# Patient Record
Sex: Male | Born: 1955 | Race: White | Hispanic: No | Marital: Married | Smoking: Former smoker
Health system: Southern US, Community
[De-identification: ages and names within clinical notes are randomized; demographics above are authoritative.]

## PROBLEM LIST (undated history)

## (undated) DIAGNOSIS — I2510 Atherosclerotic heart disease of native coronary artery without angina pectoris: Secondary | ICD-10-CM

## (undated) DIAGNOSIS — J449 Chronic obstructive pulmonary disease, unspecified: Secondary | ICD-10-CM

## (undated) DIAGNOSIS — F1011 Alcohol abuse, in remission: Secondary | ICD-10-CM

## (undated) DIAGNOSIS — R0681 Apnea, not elsewhere classified: Secondary | ICD-10-CM

## (undated) DIAGNOSIS — N281 Cyst of kidney, acquired: Secondary | ICD-10-CM

## (undated) DIAGNOSIS — K439 Ventral hernia without obstruction or gangrene: Secondary | ICD-10-CM

## (undated) DIAGNOSIS — I10 Essential (primary) hypertension: Secondary | ICD-10-CM

## (undated) DIAGNOSIS — K219 Gastro-esophageal reflux disease without esophagitis: Secondary | ICD-10-CM

## (undated) HISTORY — DX: Apnea, not elsewhere classified: R06.81

## (undated) HISTORY — DX: Ventral hernia without obstruction or gangrene: K43.9

## (undated) HISTORY — DX: Atherosclerotic heart disease of native coronary artery without angina pectoris: I25.10

## (undated) HISTORY — DX: Alcohol abuse, in remission: F10.11

## (undated) HISTORY — DX: Essential (primary) hypertension: I10

## (undated) HISTORY — PX: CARDIAC CATHETERIZATION: SHX172

## (undated) HISTORY — PX: CHOLECYSTECTOMY: SHX55

## (undated) HISTORY — PX: HERNIA REPAIR: SHX51

## (undated) HISTORY — DX: Gastro-esophageal reflux disease without esophagitis: K21.9

## (undated) HISTORY — PX: OTHER SURGICAL HISTORY: SHX169

## (undated) HISTORY — DX: Chronic obstructive pulmonary disease, unspecified: J44.9

## (undated) HISTORY — DX: Cyst of kidney, acquired: N28.1

---

## 2005-12-14 ENCOUNTER — Ambulatory Visit: Payer: Self-pay | Admitting: Family Medicine

## 2006-11-16 ENCOUNTER — Ambulatory Visit: Payer: Self-pay | Admitting: Gastroenterology

## 2008-06-12 ENCOUNTER — Inpatient Hospital Stay: Payer: Self-pay | Admitting: Internal Medicine

## 2008-08-21 ENCOUNTER — Ambulatory Visit: Payer: Self-pay | Admitting: Internal Medicine

## 2008-10-01 ENCOUNTER — Ambulatory Visit: Payer: Self-pay | Admitting: Family Medicine

## 2011-10-03 ENCOUNTER — Ambulatory Visit: Payer: Self-pay | Admitting: Gastroenterology

## 2011-10-03 HISTORY — PX: ESOPHAGOGASTRODUODENOSCOPY: SHX1529

## 2011-10-05 LAB — PATHOLOGY REPORT

## 2011-12-29 DIAGNOSIS — Z0189 Encounter for other specified special examinations: Secondary | ICD-10-CM | POA: Insufficient documentation

## 2011-12-29 DIAGNOSIS — F1011 Alcohol abuse, in remission: Secondary | ICD-10-CM

## 2011-12-29 DIAGNOSIS — Z7189 Other specified counseling: Secondary | ICD-10-CM | POA: Insufficient documentation

## 2011-12-29 DIAGNOSIS — N281 Cyst of kidney, acquired: Secondary | ICD-10-CM

## 2011-12-29 DIAGNOSIS — K219 Gastro-esophageal reflux disease without esophagitis: Secondary | ICD-10-CM

## 2011-12-29 DIAGNOSIS — I2510 Atherosclerotic heart disease of native coronary artery without angina pectoris: Secondary | ICD-10-CM | POA: Insufficient documentation

## 2011-12-29 DIAGNOSIS — K439 Ventral hernia without obstruction or gangrene: Secondary | ICD-10-CM

## 2011-12-29 HISTORY — DX: Gastro-esophageal reflux disease without esophagitis: K21.9

## 2011-12-29 HISTORY — DX: Atherosclerotic heart disease of native coronary artery without angina pectoris: I25.10

## 2011-12-29 HISTORY — DX: Cyst of kidney, acquired: N28.1

## 2011-12-29 HISTORY — DX: Ventral hernia without obstruction or gangrene: K43.9

## 2011-12-29 HISTORY — DX: Alcohol abuse, in remission: F10.11

## 2012-12-28 ENCOUNTER — Ambulatory Visit: Payer: Self-pay | Admitting: Family Medicine

## 2013-02-08 HISTORY — PX: COLONOSCOPY: SHX174

## 2013-04-02 DIAGNOSIS — J9801 Acute bronchospasm: Secondary | ICD-10-CM | POA: Insufficient documentation

## 2013-04-16 ENCOUNTER — Ambulatory Visit: Payer: Self-pay | Admitting: Family Medicine

## 2013-04-30 DIAGNOSIS — J449 Chronic obstructive pulmonary disease, unspecified: Secondary | ICD-10-CM | POA: Insufficient documentation

## 2013-04-30 HISTORY — DX: Chronic obstructive pulmonary disease, unspecified: J44.9

## 2014-01-09 DIAGNOSIS — M25473 Effusion, unspecified ankle: Secondary | ICD-10-CM | POA: Insufficient documentation

## 2014-02-12 DIAGNOSIS — I10 Essential (primary) hypertension: Secondary | ICD-10-CM | POA: Insufficient documentation

## 2014-02-12 HISTORY — DX: Essential (primary) hypertension: I10

## 2014-02-13 DIAGNOSIS — E782 Mixed hyperlipidemia: Secondary | ICD-10-CM | POA: Insufficient documentation

## 2014-02-20 ENCOUNTER — Emergency Department: Payer: Self-pay | Admitting: Emergency Medicine

## 2014-02-20 LAB — CBC WITH DIFFERENTIAL/PLATELET
BASOS PCT: 1 %
Basophil #: 0.1 10*3/uL (ref 0.0–0.1)
Eosinophil #: 0.4 10*3/uL (ref 0.0–0.7)
Eosinophil %: 3.6 %
HCT: 41.8 % (ref 40.0–52.0)
HGB: 14.1 g/dL (ref 13.0–18.0)
LYMPHS ABS: 2.8 10*3/uL (ref 1.0–3.6)
Lymphocyte %: 28.2 %
MCH: 31.8 pg (ref 26.0–34.0)
MCHC: 33.7 g/dL (ref 32.0–36.0)
MCV: 94 fL (ref 80–100)
Monocyte #: 0.5 x10 3/mm (ref 0.2–1.0)
Monocyte %: 5.5 %
NEUTROS PCT: 61.7 %
Neutrophil #: 6.2 10*3/uL (ref 1.4–6.5)
PLATELETS: 211 10*3/uL (ref 150–440)
RBC: 4.43 10*6/uL (ref 4.40–5.90)
RDW: 14 % (ref 11.5–14.5)
WBC: 10 10*3/uL (ref 3.8–10.6)

## 2014-02-20 LAB — COMPREHENSIVE METABOLIC PANEL
Albumin: 3.8 g/dL (ref 3.4–5.0)
Alkaline Phosphatase: 85 U/L
Anion Gap: 11 (ref 7–16)
BILIRUBIN TOTAL: 0.6 mg/dL (ref 0.2–1.0)
BUN: 19 mg/dL — ABNORMAL HIGH (ref 7–18)
Calcium, Total: 8.6 mg/dL (ref 8.5–10.1)
Chloride: 105 mmol/L (ref 98–107)
Co2: 27 mmol/L (ref 21–32)
Creatinine: 0.92 mg/dL (ref 0.60–1.30)
EGFR (African American): >60
EGFR (Non-African Amer.): >60
Glucose: 100 mg/dL — ABNORMAL HIGH (ref 65–99)
Osmolality: 287 (ref 275–301)
Potassium: 3.5 mmol/L (ref 3.5–5.1)
SGOT(AST): 26 U/L (ref 15–37)
SGPT (ALT): 41 U/L
Sodium: 143 mmol/L (ref 136–145)
Total Protein: 7.1 g/dL (ref 6.4–8.2)

## 2014-02-20 LAB — MAGNESIUM: Magnesium: 0.7 mg/dL — ABNORMAL LOW

## 2014-06-09 DIAGNOSIS — R0681 Apnea, not elsewhere classified: Secondary | ICD-10-CM

## 2014-06-09 HISTORY — DX: Apnea, not elsewhere classified: R06.81

## 2014-07-22 DIAGNOSIS — R002 Palpitations: Secondary | ICD-10-CM | POA: Insufficient documentation

## 2014-11-25 ENCOUNTER — Emergency Department
Admission: EM | Admit: 2014-11-25 | Discharge: 2014-11-25 | Disposition: A | Discharge status: Home / Self Care | Payer: Managed Care, Other (non HMO) | Attending: Emergency Medicine | Admitting: Emergency Medicine

## 2014-11-25 ENCOUNTER — Emergency Department: Payer: Managed Care, Other (non HMO)

## 2014-11-25 ENCOUNTER — Encounter: Payer: Self-pay | Admitting: Emergency Medicine

## 2014-11-25 DIAGNOSIS — R1013 Epigastric pain: Secondary | ICD-10-CM | POA: Diagnosis present

## 2014-11-25 DIAGNOSIS — K2980 Duodenitis without bleeding: Secondary | ICD-10-CM | POA: Diagnosis not present

## 2014-11-25 DIAGNOSIS — Z7951 Long term (current) use of inhaled steroids: Secondary | ICD-10-CM | POA: Insufficient documentation

## 2014-11-25 DIAGNOSIS — Z79899 Other long term (current) drug therapy: Secondary | ICD-10-CM | POA: Insufficient documentation

## 2014-11-25 DIAGNOSIS — Z7982 Long term (current) use of aspirin: Secondary | ICD-10-CM | POA: Diagnosis not present

## 2014-11-25 DIAGNOSIS — I10 Essential (primary) hypertension: Secondary | ICD-10-CM | POA: Insufficient documentation

## 2014-11-25 DIAGNOSIS — Z87891 Personal history of nicotine dependence: Secondary | ICD-10-CM | POA: Diagnosis not present

## 2014-11-25 HISTORY — DX: Essential (primary) hypertension: I10

## 2014-11-25 HISTORY — DX: Chronic obstructive pulmonary disease, unspecified: J44.9

## 2014-11-25 LAB — URINALYSIS COMPLETE WITH MICROSCOPIC (ARMC ONLY)
Bacteria, UA: NONE SEEN
Bilirubin Urine: NEGATIVE
GLUCOSE, UA: NEGATIVE mg/dL
HGB URINE DIPSTICK: NEGATIVE
Leukocytes, UA: NEGATIVE
Nitrite: NEGATIVE
PH: 8 (ref 5.0–8.0)
Protein, ur: 30 mg/dL — AB
SQUAMOUS EPITHELIAL / LPF: NONE SEEN
Specific Gravity, Urine: 1.017 (ref 1.005–1.030)

## 2014-11-25 LAB — CBC WITH DIFFERENTIAL/PLATELET
BASOS ABS: 0.1 10*3/uL (ref 0–0.1)
Eosinophils Absolute: 0.1 10*3/uL (ref 0–0.7)
Eosinophils Relative: 1 %
HCT: 43.6 % (ref 40.0–52.0)
Hemoglobin: 14.9 g/dL (ref 13.0–18.0)
Lymphs Abs: 0.8 10*3/uL — ABNORMAL LOW (ref 1.0–3.6)
MCH: 31.8 pg (ref 26.0–34.0)
MCHC: 34.2 g/dL (ref 32.0–36.0)
MCV: 92.8 fL (ref 80.0–100.0)
Monocytes Absolute: 0.4 10*3/uL (ref 0.2–1.0)
Monocytes Relative: 3 %
NEUTROS ABS: 11.5 10*3/uL — AB (ref 1.4–6.5)
Neutrophils Relative %: 90 %
Platelets: 159 10*3/uL (ref 150–440)
RBC: 4.7 MIL/uL (ref 4.40–5.90)
RDW: 13.6 % (ref 11.5–14.5)
WBC: 12.8 10*3/uL — ABNORMAL HIGH (ref 3.8–10.6)

## 2014-11-25 LAB — LIPASE, BLOOD: LIPASE: 28 U/L (ref 22–51)

## 2014-11-25 LAB — COMPREHENSIVE METABOLIC PANEL
ALK PHOS: 76 U/L (ref 38–126)
ALT: 23 U/L (ref 17–63)
AST: 31 U/L (ref 15–41)
Albumin: 4.2 g/dL (ref 3.5–5.0)
Anion gap: 11 (ref 5–15)
BUN: 16 mg/dL (ref 6–20)
CO2: 26 mmol/L (ref 22–32)
CREATININE: 1.07 mg/dL (ref 0.61–1.24)
Calcium: 9.4 mg/dL (ref 8.9–10.3)
Chloride: 101 mmol/L (ref 101–111)
GFR calc non Af Amer: >60 mL/min (ref >60)
GLUCOSE: 158 mg/dL — AB (ref 65–99)
Potassium: 3.5 mmol/L (ref 3.5–5.1)
Sodium: 138 mmol/L (ref 135–145)
Total Bilirubin: 1.3 mg/dL — ABNORMAL HIGH (ref 0.3–1.2)
Total Protein: 7 g/dL (ref 6.5–8.1)

## 2014-11-25 LAB — TROPONIN I
Troponin I: <0.03 ng/mL (ref <0.031)
Troponin I: <0.03 ng/mL (ref <0.031)

## 2014-11-25 MED ORDER — GI COCKTAIL ~~LOC~~
30.0000 mL | Freq: Once | ORAL | Status: AC
  Administered 2014-11-25: 30 mL via ORAL

## 2014-11-25 MED ORDER — GI COCKTAIL ~~LOC~~
ORAL | Status: AC
  Administered 2014-11-25: 30 mL via ORAL
  Filled 2014-11-25: qty 30

## 2014-11-25 MED ORDER — IOHEXOL 300 MG/ML  SOLN
100.0000 mL | Freq: Once | INTRAMUSCULAR | Status: AC | PRN
  Administered 2014-11-25: 100 mL via INTRAVENOUS

## 2014-11-25 MED ORDER — ASPIRIN 81 MG PO CHEW
CHEWABLE_TABLET | ORAL | Status: AC
  Administered 2014-11-25: 243 mg via ORAL
  Filled 2014-11-25: qty 3

## 2014-11-25 MED ORDER — ONDANSETRON HCL 4 MG/2ML IJ SOLN
4.0000 mg | Freq: Once | INTRAMUSCULAR | Status: AC
  Administered 2014-11-25: 4 mg via INTRAVENOUS
  Filled 2014-11-25: qty 2

## 2014-11-25 MED ORDER — ASPIRIN 81 MG PO CHEW
243.0000 mg | CHEWABLE_TABLET | Freq: Once | ORAL | Status: AC
  Administered 2014-11-25: 243 mg via ORAL

## 2014-11-25 MED ORDER — OMEPRAZOLE 20 MG PO CPDR
20.0000 mg | DELAYED_RELEASE_CAPSULE | Freq: Two times a day (BID) | ORAL | Status: DC
Start: 2014-11-25 — End: 2015-01-01

## 2014-11-25 MED ORDER — IOHEXOL 240 MG/ML SOLN
25.0000 mL | Freq: Once | INTRAMUSCULAR | Status: AC | PRN
Start: 2014-11-25 — End: 2014-11-25
  Administered 2014-11-25: 25 mL via ORAL

## 2014-11-25 NOTE — ED Notes (Signed)
Patient ambulatory to triage with steady gait, without difficulty or distress noted; pt reports since 10pm having mid upper abd pain accomp by N/V; denies hx of same

## 2014-11-25 NOTE — ED Notes (Signed)
Finished contrast called CT. CT awaiting on labs

## 2014-11-25 NOTE — ED Notes (Signed)

## 2014-11-25 NOTE — ED Notes (Signed)
20 gauge IV removed from left antecubital, catheter removed intact, dressing applied, bleeding controlled.

## 2014-11-25 NOTE — ED Notes (Signed)
MD at bedside. 

## 2014-11-25 NOTE — ED Provider Notes (Signed)
[facility] Emergency Department Provider Note  ____________________________________________  Time seen: 2:15 AM  I have reviewed the triage vital signs and the nursing notes.   HISTORY  Chief Complaint Abdominal Pain      HPI Dean Tanner is a 59 y.o. male presents with epigastric abdominal pain accompanied by nausea and vomiting with onset at 10 PM last night. Patient states current pain score is a 5 out of 10 and nonradiating. Patient denies any chest pain no shortness of breath. He admits to previous episodes of same. Of note patient states that he he had a bacterial infection in the stomach many years ago that required oral antibiotics. In addition patient states that he's had 2 catheterization done most recent of which was 4 years ago with "small blockages in small vessels that did not require intervention performed by Dr. Nehemiah Massed.     Past Medical History  Diagnosis Date  . Hypertension   . COPD (chronic obstructive pulmonary disease)     There are no active problems to display for this patient.   Past Surgical History  Procedure Laterality Date  . Cholecystectomy      Current Outpatient Rx  Name  Route  Sig  Dispense  Refill  . ADVAIR DISKUS 100-50 MCG/DOSE AEPB   Inhalation   Inhale 1 puff into the lungs daily.      12     Dispense as written.   Marland Kitchen aspirin EC 81 MG tablet   Oral   Take 81 mg by mouth daily.         Marland Kitchen atenolol (TENORMIN) 25 MG tablet   Oral   Take 12.5 mg by mouth daily.         Marland Kitchen atorvastatin (LIPITOR) 40 MG tablet   Oral   Take 1 tablet by mouth daily.      2   . cetirizine (ZYRTEC) 10 MG tablet   Oral   Take 10 mg by mouth daily.         . fluticasone (FLONASE) 50 MCG/ACT nasal spray   Each Nare   Place 2 sprays into both nostrils daily.         . Magnesium 250 MG TABS   Oral   Take 1 tablet by mouth daily.         . Multiple Vitamin (MULTIVITAMIN WITH MINERALS) TABS tablet  Oral   Take 1 tablet by mouth daily.         Marland Kitchen omeprazole (PRILOSEC) 20 MG capsule   Oral   Take 20 mg by mouth daily.         . potassium chloride SA (K-DUR,KLOR-CON) 20 MEQ tablet   Oral   Take 1 tablet by mouth every other day.         . predniSONE (DELTASONE) 20 MG tablet   Oral   Take 20 mg by mouth daily with breakfast.           Allergies Codeine  No family history on file.  Social History History  Substance Use Topics  . Smoking status: Former Research scientist (life sciences)  . Smokeless tobacco: Not on file  . Alcohol Use: Yes     Comment: "several drinks a night"    Review of Systems  Constitutional: Negative for fever. Eyes: Negative for visual changes. ENT: Negative for sore throat. Cardiovascular: Negative for chest pain. Respiratory: Negative for shortness of breath. Gastrointestinal: Positive for abdominal pain. Genitourinary: Negative for dysuria. Musculoskeletal: Negative for back pain. Skin: Negative  for rash. Neurological: Negative for headaches, focal weakness or numbness.   10-point ROS otherwise negative.  ____________________________________________   PHYSICAL EXAM:  VITAL SIGNS: ED Triage Vitals  Enc Vitals Group     BP 11/25/14 0150 183/91 mmHg     Pulse Rate 11/25/14 0150 85     Resp 11/25/14 0150 18     Temp 11/25/14 0150 97.6 F (36.4 C)     Temp Source 11/25/14 0150 Oral     SpO2 11/25/14 0150 98 %     Weight 11/25/14 0150 224 lb (101.606 kg)     Height 11/25/14 0150 5\' 8"  (1.727 m)     Head Cir --      Peak Flow --      Pain Score 11/25/14 0146 8     Pain Loc --      Pain Edu? --      Excl. in [place]? --      Constitutional: Alert and oriented. Well appearing and in no distress. Eyes: Conjunctivae are normal. PERRL. Normal extraocular movements. ENT   Head: Normocephalic and atraumatic.   Nose: No congestion/rhinnorhea.   Mouth/Throat: Mucous membranes are moist.   Neck: No stridor. Cardiovascular: Normal rate,  regular rhythm. Normal and symmetric distal pulses are present in all extremities. No murmurs, rubs, or gallops. Respiratory: Normal respiratory effort without tachypnea nor retractions. Breath sounds are clear and equal bilaterally. No wheezes/rales/rhonchi. Gastrointestinal: Soft and nontender. No distention. There is no CVA tenderness. Genitourinary: deferred Musculoskeletal: Nontender with normal range of motion in all extremities. No joint effusions.  No lower extremity tenderness nor edema. Neurologic:  Normal speech and language. No gross focal neurologic deficits are appreciated. Speech is normal.  Skin:  Skin is warm, dry and intact. No rash noted. Psychiatric: Mood and affect are normal. Speech and behavior are normal. Patient exhibits appropriate insight and judgment.  ____________________________________________    LABS (pertinent positives/negatives)  Labs Reviewed  COMPREHENSIVE METABOLIC PANEL - Abnormal; Notable for the following:    Glucose, Bld 158 (*)    Total Bilirubin 1.3 (*)    All other components within normal limits  URINALYSIS COMPLETEWITH MICROSCOPIC ([facility] ONLY) - Abnormal; Notable for the following:    Color, Urine YELLOW (*)    APPearance CLEAR (*)    Ketones, ur TRACE (*)    Protein, ur 30 (*)    All other components within normal limits  CBC WITH DIFFERENTIAL/PLATELET - Abnormal; Notable for the following:    WBC 12.8 (*)    Neutro Abs 11.5 (*)    Lymphs Abs 0.8 (*)    All other components within normal limits  LIPASE, BLOOD  TROPONIN I  TROPONIN I     ____________________________________________   EKG  ED ECG REPORT I, Rutherford Alarie, Chapman N, the attending physician, personally viewed and interpreted this ECG.   Date: 11/25/2014  EKG Time: 1:47 AM  Rate: 86  Rhythm: Normal sinus rhythm  Axis: None  Intervals:normal  ST&T Change: none   ____________________________________________    RADIOLOGY  IMPRESSION: Inflammatory changes of  the duodenum can be seen with duodenitis,, trauma, occult ulceration. Findings associated with pancreatitis though, no CT findings of pancreatic inflammation.  Status post cholecystectomy.   Electronically Signed By: Elon Alas M.D. On: 11/25/2014 03:56      INITIAL IMPRESSION / ASSESSMENT AND PLAN / ED COURSE  Pertinent labs & imaging results that were available during my care of the patient were reviewed by me and considered in my medical decision  making (see chart for details).  ON negative 2 patient's pain is located in the epigastrium region tenderness reproducible with palpation. Therefore CT finding of duodenitis and possible ulceration likely also given previous history of possible gastric ulcer disease. As such patient will be referred to gastroenterology for outpatient follow-up.  ____________________________________________   FINAL CLINICAL IMPRESSION(S) / ED DIAGNOSES  Final diagnoses:  Duodenitis      Gregor Hams, MD 11/25/14 [phone]

## 2014-11-25 NOTE — Discharge Instructions (Signed)
Duodenitis Duodenitis is inflammation of the lining of the first part of your small intestine (duodenum). There are two types of duodenitis:  Acute duodenitis (develops suddenly and is short lived).   Chronic duodenitis (develops over an extended period and lasts months to years). CAUSES  Duodenitis is most often caused by infection with the bacterium Helicobacter pylori (H. pylori). H. pylori increases the production of stomach acid and causes changes in the environment of the duodenum. This irritates and damages the cells of the duodenum causing inflammation. Other causes of duodenitis include:   Long-term use of nonsteroidal anti-inflammatory drugs (NSAIDs). NSAIDs change the lining of the duodenum and make it more prone to injury from stomach acid.  Excessive use of alcohol. Alcohol increases stomach acid and changes the lining of the duodenum which makes it more likely for inflammation to develop.  Giardiasis. Giardiasis is a common infection of the small intestine. It can cause inflammation of the duodenum.   Other gastrointestinal disorders, such as Crohn disease. People with these disorders are more likely to develop duodenitis. SYMPTOMS  Although duodenitis does not always cause symptoms, symptoms that do occur include:  Nausea or vomiting.  Gassy, bloated feeling or an uncomfortable feeling of fullness after eating.  Burning, cramps, or pain in the upper abdominal area. DIAGNOSIS  To diagnose duodenitis, your health care provider may use results from:   An exam of the duodenum using a thin tube with a tiny camera on the tip, which is placed down your throat (endoscope). The endoscope is passed through your stomach and into your duodenum. Sometimes a sample of tissue from your duodenum is removed with the endoscope. The sample is then examined under a microscope (biopsy) for signs of inflammation and H. pylori infection.   Tests that check samples of your blood or stool for  H. pylori infection.   A test that checks the gases in a sample of your expired breath for H. pylori infection. The test measures the levels of carbon dioxide in your breath after you drink a special solution.  An X-ray exam using a special liquid that you swallow to illuminate your digestive tract (barium) to show signs of inflammation. TREATMENT  Treatment will depend on the cause of the duodenitis. The most common treatments include:  Use of medication to treat infection.  Medication to reduce stomach acid.  Discontinuing the use of NSAIDs.  Management of other gastrointestinal conditions.  Avoiding alcohol consumption. Additionally, taking the following steps can help to reduce the severity of your symptoms:  Drink enough water to keep your urine clear or pale yellow.  Avoid consuming these foods or drinks:  Caffeinated drinks.  Chocolate.  Peppermint or mint-flavored food or drinks.  Garlic.  Onions.  Spicy foods.  Citrus fruits, such as oranges, lemons, or limes.  Foods that use tomato-based sauces, such as pasta sauce, chili, salsa, and pizza.  Fatty foods.  Fried foods. Document Released: 08/06/2012 Document Revised: 08/26/2013 Document Reviewed: 08/06/2012 [facility] Patient Information 2015 [place], [place]. This information is not intended to replace advice given to you by your health care provider. Make sure you discuss any questions you have with your health care provider.

## 2014-12-24 ENCOUNTER — Other Ambulatory Visit: Payer: Self-pay

## 2014-12-31 ENCOUNTER — Telehealth: Payer: Self-pay | Admitting: Gastroenterology

## 2014-12-31 ENCOUNTER — Encounter (INDEPENDENT_AMBULATORY_CARE_PROVIDER_SITE_OTHER): Payer: Self-pay

## 2014-12-31 ENCOUNTER — Other Ambulatory Visit: Payer: Self-pay

## 2014-12-31 ENCOUNTER — Encounter: Payer: Self-pay | Admitting: Gastroenterology

## 2014-12-31 ENCOUNTER — Ambulatory Visit (INDEPENDENT_AMBULATORY_CARE_PROVIDER_SITE_OTHER): Payer: 59 | Admitting: Gastroenterology

## 2014-12-31 VITALS — BP 177/94 | HR 64 | Temp 98.2°F | Ht 68.0 in | Wt 232.0 lb

## 2014-12-31 DIAGNOSIS — G8929 Other chronic pain: Secondary | ICD-10-CM | POA: Diagnosis not present

## 2014-12-31 DIAGNOSIS — R1013 Epigastric pain: Secondary | ICD-10-CM

## 2014-12-31 NOTE — Telephone Encounter (Signed)
Patient would like to reschedule endo appointment

## 2014-12-31 NOTE — Telephone Encounter (Signed)
Rescheduled EGD to 01-09-15. [place] called and changed.

## 2014-12-31 NOTE — Progress Notes (Signed)
Gastroenterology Consultation  Referring Provider:     No ref. provider found Primary Care Physician:  Pcp Not In System Primary Gastroenterologist:  Dr. Allen Norris     Reason for Consultation:     Abdominal pain        HPI:   Dean Tanner is a 59 y.o. y/o male referred for consultation & management of abdominal pain by Dr. Merryl Hacker Not In System.  This patient comes today for a few months of abdominal pain. The patient states his abdominal pain is in the middle to right upper quadrant. The patient has had his gallbladder taken out in the past. The patient states that food makes his symptoms worse and sodas tomato sauce. He was in the emergency room for severe pain and had a CT scan that showed duodenitis. The patient was on omeprazole and states that he was told to take the omeprazole twice a day for the findings of his CT scan. The patient denies any unexplained weight loss. He also denies any change in bowel habits. The patient appears to have adequate colonoscopy by me in 2008. There is no report of any fevers or chills but he does report that he has sweats from the pain when it becomes severe.  Past Medical History  Diagnosis Date  . Hypertension   . COPD (chronic obstructive pulmonary disease)   . Arteriosclerosis of coronary artery 12/29/2011    Overview:  Diffuse three vessel   . Benign essential HTN 02/12/2014  . 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 Vella Kohler   . Acid reflux 12/29/2011  . Kidney cysts 12/29/2011  . H/O alcohol abuse 12/29/2011  . Breathlessness on exertion 06/09/2014  . Hernia of anterior abdominal wall 12/29/2011    Past Surgical History  Procedure Laterality Date  . Cholecystectomy    . Laser benign laryngeal lesion    . Hernia repair    . Cardiac catheterization    . Esophagogastroduodenoscopy  10/03/2011  . Colonoscopy  02/08/2013    Prior to Admission  medications   Medication Sig Start Date End Date Taking? Authorizing Provider  ADVAIR DISKUS 100-50 MCG/DOSE AEPB Inhale 1 puff into the lungs daily. 11/08/14  Yes Historical Provider, MD  aspirin EC 81 MG tablet Take 81 mg by mouth daily.   Yes Historical Provider, MD  atenolol (TENORMIN) 25 MG tablet Take 12.5 mg by mouth daily.   Yes Historical Provider, MD  atorvastatin (LIPITOR) 40 MG tablet Take 1 tablet by mouth daily. 10/08/14  Yes Historical Provider, MD  cetirizine (ZYRTEC) 10 MG tablet Take 10 mg by mouth daily.   Yes Historical Provider, MD  fluticasone (FLONASE) 50 MCG/ACT nasal spray Place 2 sprays into both nostrils daily.   Yes Historical Provider, MD  lisinopril (PRINIVIL,ZESTRIL) 20 MG tablet Take by mouth. 12/18/14  Yes Historical Provider, MD  Magnesium 250 MG TABS Take 1 tablet by mouth daily.   Yes Historical Provider, MD  Multiple Vitamin (MULTIVITAMIN WITH MINERALS) TABS tablet Take 1 tablet by mouth daily.   Yes Historical Provider, MD  nitroGLYCERIN (NITROSTAT) 0.4 MG SL tablet Place under the tongue. 06/12/14  Yes Historical Provider, MD  omeprazole (PRILOSEC) 20 MG capsule Take 20 mg by mouth daily.   Yes Historical Provider, MD  omeprazole (PRILOSEC) 20 MG capsule Take 1 capsule (20 mg total) by mouth 2 (two) times daily. 11/25/14 11/25/15 Yes Gregor Hams,  MD  potassium chloride SA (K-DUR,KLOR-CON) 20 MEQ tablet Take 1 tablet by mouth every other day. 03/31/14 02/26/15 Yes Historical Provider, MD  predniSONE (DELTASONE) 20 MG tablet Take 20 mg by mouth daily with breakfast.   Yes Historical Provider, MD  traZODone (DESYREL) 50 MG tablet Take by mouth. 12/18/14 12/18/15 Yes Historical Provider, MD  isosorbide mononitrate (IMDUR) 30 MG 24 hr tablet Take by mouth. 12/18/14   Historical Provider, MD    Family History  Problem Relation Age of Onset  . Coronary artery disease Father   . Colon polyps Father   . Gallbladder disease Father   . Coronary artery disease Mother   .  Hypertension Mother   . Heart failure Mother   . Gallbladder disease Brother   . Colon polyps Brother      Social History  Substance Use Topics  . Smoking status: Former Research scientist (life sciences)  . Smokeless tobacco: Former Systems developer    Types: Snuff  . Alcohol Use: 0.0 oz/week    0 Standard drinks or equivalent per week     Comment: "several drinks a night"    Allergies as of 12/31/2014 - Review Complete 12/31/2014  Allergen Reaction Noted  . Codeine Other (See Comments) 11/25/2014    Review of Systems:    All systems reviewed and negative except where noted in HPI.   Physical Exam:  BP 177/94 mmHg  Pulse 64  Temp(Src) 98.2 F (36.8 C) (Oral)  Ht 5\' 8"  (1.727 m)  Wt 232 lb (105.235 kg)  BMI 35.28 kg/m2 No LMP for male patient. Psych:  Alert and cooperative. Normal mood and affect. General:   Alert,  Well-developed, well-nourished, obese,  pleasant and cooperative in NAD Head:  Normocephalic and atraumatic. Eyes:  Sclera clear, no icterus.   Conjunctiva pink. Ears:  Normal auditory acuity. Nose:  No deformity, discharge, or lesions. Mouth:  No deformity or lesions,oropharynx pink & moist. Neck:  Supple; no masses or thyromegaly. Lungs:  Respirations even and unlabored.  Clear throughout to auscultation.   No wheezes, crackles, or rhonchi. No acute distress. Heart:  Regular rate and rhythm; no murmurs, clicks, rubs, or gallops. Abdomen:  Normal bowel sounds.  No bruits.  Soft, non-tender and non-distended without masses, hepatosplenomegaly or hernias noted.  No guarding or rebound tenderness.  Negative Carnett sign.   Rectal:  Deferred.  Msk:  Symmetrical without gross deformities.  Good, equal movement & strength bilaterally. Pulses:  Normal pulses noted. Extremities:  No clubbing or edema.  No cyanosis. Neurologic:  Alert and oriented x3;  grossly normal neurologically. Skin:  Intact without significant lesions or rashes.  No jaundice. Lymph Nodes:  No significant cervical  adenopathy. Psych:  Alert and cooperative. Normal mood and affect.  Imaging Studies: No results found.  Assessment and Plan:   Dean Tanner is a 59 y.o. y/o male  who comes in with a few months of abdominal pain that had been so bad that he went to the emergency room. The patient had a CT scan that showed what appeared to be thickened duodenum consistent duodenitis. The patient was on a PPI and he was told to take his PPI twice a day. The patient will be set up for an upper endoscopy to look for possible inflammatory versus infiltrating disease of the duodenum. He will also be given samples of Dexilant to be taken daily. The patient has been explained the plan and agrees with it. I have discussed risks & benefits which include, but are not  limited to, bleeding, infection, perforation & drug reaction.  The patient agrees with this plan & written consent will be obtained.      Note: This dictation was prepared with Dragon dictation along with smaller phrase technology. Any transcriptional errors that result from this process are unintentional.

## 2014-12-31 NOTE — Telephone Encounter (Signed)
LVM for pt to return my call to reschedule EGD.

## 2015-01-08 NOTE — Discharge Instructions (Signed)

## 2015-01-09 ENCOUNTER — Encounter
Admission: RE | Disposition: A | Discharge status: Home / Self Care | Payer: Self-pay | Source: Ambulatory Visit | Attending: Gastroenterology

## 2015-01-09 ENCOUNTER — Ambulatory Visit
Admission: RE | Admit: 2015-01-09 | Discharge: 2015-01-09 | Disposition: A | Discharge status: Home / Self Care | Payer: Managed Care, Other (non HMO) | Source: Ambulatory Visit | Attending: Gastroenterology | Admitting: Gastroenterology

## 2015-01-09 ENCOUNTER — Ambulatory Visit: Payer: Managed Care, Other (non HMO) | Admitting: Anesthesiology

## 2015-01-09 ENCOUNTER — Other Ambulatory Visit: Payer: Self-pay | Admitting: Gastroenterology

## 2015-01-09 DIAGNOSIS — R1011 Right upper quadrant pain: Secondary | ICD-10-CM | POA: Insufficient documentation

## 2015-01-09 DIAGNOSIS — Z9889 Other specified postprocedural states: Secondary | ICD-10-CM | POA: Insufficient documentation

## 2015-01-09 DIAGNOSIS — R933 Abnormal findings on diagnostic imaging of other parts of digestive tract: Secondary | ICD-10-CM

## 2015-01-09 DIAGNOSIS — I2510 Atherosclerotic heart disease of native coronary artery without angina pectoris: Secondary | ICD-10-CM | POA: Diagnosis not present

## 2015-01-09 DIAGNOSIS — Z87891 Personal history of nicotine dependence: Secondary | ICD-10-CM | POA: Diagnosis not present

## 2015-01-09 DIAGNOSIS — Z9049 Acquired absence of other specified parts of digestive tract: Secondary | ICD-10-CM | POA: Diagnosis not present

## 2015-01-09 DIAGNOSIS — K2970 Gastritis, unspecified, without bleeding: Secondary | ICD-10-CM | POA: Insufficient documentation

## 2015-01-09 DIAGNOSIS — Z79899 Other long term (current) drug therapy: Secondary | ICD-10-CM | POA: Diagnosis not present

## 2015-01-09 DIAGNOSIS — R198 Other specified symptoms and signs involving the digestive system and abdomen: Secondary | ICD-10-CM | POA: Insufficient documentation

## 2015-01-09 DIAGNOSIS — K2950 Unspecified chronic gastritis without bleeding: Secondary | ICD-10-CM | POA: Insufficient documentation

## 2015-01-09 DIAGNOSIS — R1013 Epigastric pain: Secondary | ICD-10-CM | POA: Insufficient documentation

## 2015-01-09 DIAGNOSIS — Z8489 Family history of other specified conditions: Secondary | ICD-10-CM | POA: Diagnosis not present

## 2015-01-09 DIAGNOSIS — K2980 Duodenitis without bleeding: Secondary | ICD-10-CM | POA: Insufficient documentation

## 2015-01-09 DIAGNOSIS — Z8249 Family history of ischemic heart disease and other diseases of the circulatory system: Secondary | ICD-10-CM | POA: Insufficient documentation

## 2015-01-09 DIAGNOSIS — Z8371 Family history of colonic polyps: Secondary | ICD-10-CM | POA: Insufficient documentation

## 2015-01-09 DIAGNOSIS — Z7951 Long term (current) use of inhaled steroids: Secondary | ICD-10-CM | POA: Diagnosis not present

## 2015-01-09 DIAGNOSIS — K219 Gastro-esophageal reflux disease without esophagitis: Secondary | ICD-10-CM | POA: Diagnosis not present

## 2015-01-09 DIAGNOSIS — Z7952 Long term (current) use of systemic steroids: Secondary | ICD-10-CM | POA: Insufficient documentation

## 2015-01-09 DIAGNOSIS — Z7982 Long term (current) use of aspirin: Secondary | ICD-10-CM | POA: Insufficient documentation

## 2015-01-09 DIAGNOSIS — J449 Chronic obstructive pulmonary disease, unspecified: Secondary | ICD-10-CM | POA: Insufficient documentation

## 2015-01-09 DIAGNOSIS — I10 Essential (primary) hypertension: Secondary | ICD-10-CM | POA: Diagnosis not present

## 2015-01-09 HISTORY — PX: ESOPHAGOGASTRODUODENOSCOPY (EGD) WITH PROPOFOL: SHX5813

## 2015-01-09 SURGERY — ESOPHAGOGASTRODUODENOSCOPY (EGD) WITH PROPOFOL
Anesthesia: Monitor Anesthesia Care

## 2015-01-09 MED ORDER — FENTANYL CITRATE (PF) 100 MCG/2ML IJ SOLN: 25.0000 ug | INTRAMUSCULAR | Status: DC | PRN

## 2015-01-09 MED ORDER — LACTATED RINGERS IV SOLN
INTRAVENOUS | Status: DC
  Administered 2015-01-09 (×2): via INTRAVENOUS

## 2015-01-09 MED ORDER — ACETAMINOPHEN 325 MG PO TABS: 325.0000 mg | ORAL_TABLET | ORAL | Status: DC | PRN

## 2015-01-09 MED ORDER — ACETAMINOPHEN 160 MG/5ML PO SOLN: 325.0000 mg | ORAL | Status: DC | PRN

## 2015-01-09 MED ORDER — GLYCOPYRROLATE 0.2 MG/ML IJ SOLN
INTRAMUSCULAR | Status: DC | PRN
  Administered 2015-01-09: 0.2 mg via INTRAVENOUS

## 2015-01-09 MED ORDER — LIDOCAINE HCL (CARDIAC) 20 MG/ML IV SOLN
INTRAVENOUS | Status: DC | PRN
  Administered 2015-01-09: 30 mg via INTRAVENOUS

## 2015-01-09 MED ORDER — STERILE WATER FOR IRRIGATION IR SOLN
Status: DC | PRN
  Administered 2015-01-09: 08:00:00

## 2015-01-09 MED ORDER — PROPOFOL 10 MG/ML IV BOLUS
INTRAVENOUS | Status: DC | PRN
  Administered 2015-01-09 (×2): 50 mg via INTRAVENOUS
  Administered 2015-01-09: 100 mg via INTRAVENOUS
  Administered 2015-01-09: 50 mg via INTRAVENOUS

## 2015-01-09 MED ORDER — DEXAMETHASONE SODIUM PHOSPHATE 4 MG/ML IJ SOLN: 8.0000 mg | Freq: Once | INTRAMUSCULAR | Status: DC | PRN

## 2015-01-09 MED ORDER — LACTATED RINGERS IV SOLN: 500.0000 mL | INTRAVENOUS | Status: DC

## 2015-01-09 SURGICAL SUPPLY — 39 items
BALLN DILATOR 10-12 8 (BALLOONS)
BALLN DILATOR 12-15 8 (BALLOONS)
BALLN DILATOR 15-18 8 (BALLOONS)
BALLN DILATOR CRE 0-12 8 (BALLOONS)
BALLN DILATOR ESOPH 8 10 CRE (MISCELLANEOUS) IMPLANT
BALLOON DILATOR 12-15 8 (BALLOONS) IMPLANT
BALLOON DILATOR 15-18 8 (BALLOONS) IMPLANT
BALLOON DILATOR CRE 0-12 8 (BALLOONS) IMPLANT
BLOCK BITE 60FR ADLT L/F GRN (MISCELLANEOUS) ×2 IMPLANT
CANISTER SUCT 1200ML W/VALVE (MISCELLANEOUS) ×2 IMPLANT
FCP ESCP3.2XJMB 240X2.8X (MISCELLANEOUS)
FORCEPS BIOP RAD 4 LRG CAP 4 (CUTTING FORCEPS) ×1 IMPLANT
FORCEPS BIOP RJ4 240 W/NDL (MISCELLANEOUS)
FORCEPS ESCP3.2XJMB 240X2.8X (MISCELLANEOUS) IMPLANT
GOWN CVR UNV OPN BCK APRN NK (MISCELLANEOUS) ×2 IMPLANT
GOWN ISOL THUMB LOOP REG UNIV (MISCELLANEOUS) ×4
HEMOCLIP INSTINCT (CLIP) IMPLANT
INJECTOR VARIJECT VIN23 (MISCELLANEOUS) IMPLANT
KIT CO2 TUBING (TUBING) IMPLANT
KIT DEFENDO VALVE AND CONN (KITS) IMPLANT
KIT ENDO PROCEDURE OLY (KITS) ×2 IMPLANT
LIGATOR MULTIBAND 6SHOOTER MBL (MISCELLANEOUS) IMPLANT
MARKER SPOT ENDO TATTOO 5ML (MISCELLANEOUS) IMPLANT
PAD GROUND ADULT SPLIT (MISCELLANEOUS) IMPLANT
SNARE SHORT THROW 13M SML OVAL (MISCELLANEOUS) IMPLANT
SNARE SHORT THROW 30M LRG OVAL (MISCELLANEOUS) IMPLANT
SPOT EX ENDOSCOPIC TATTOO (MISCELLANEOUS)
SUCTION POLY TRAP 4CHAMBER (MISCELLANEOUS) IMPLANT
SYR INFLATION 60ML (SYRINGE) IMPLANT
TRAP SUCTION POLY (MISCELLANEOUS) IMPLANT
TUBING CONN 6MMX3.1M (TUBING)
TUBING SUCTION CONN 0.25 STRL (TUBING) IMPLANT
UNDERPAD 30X60 958B10 (PK) (MISCELLANEOUS) IMPLANT
VALVE BIOPSY ENDO (VALVE) IMPLANT
VARIJECT INJECTOR VIN23 (MISCELLANEOUS)
WATER AUXILLARY (MISCELLANEOUS) IMPLANT
WATER STERILE IRR 250ML POUR (IV SOLUTION) ×2 IMPLANT
WATER STERILE IRR 500ML POUR (IV SOLUTION) IMPLANT
WIRE CRE 18-20MM 8CM F G (MISCELLANEOUS) IMPLANT

## 2015-01-09 NOTE — Anesthesia Postprocedure Evaluation (Signed)
  Anesthesia Post-op Note  Patient: Dean Tanner  Procedure(s) Performed: Procedure(s): ESOPHAGOGASTRODUODENOSCOPY (EGD) WITH PROPOFOL (N/A)  Anesthesia type:MAC  Patient location: PACU  Post pain: Pain level controlled  Post assessment: Post-op Vital signs reviewed, Patient's Cardiovascular Status Stable, Respiratory Function Stable, Patent Airway and No signs of Nausea or vomiting  Post vital signs: Reviewed and stable  Last Vitals:  Filed Vitals:   01/09/15 0817  BP: 142/74  Pulse: 85  Temp: 36.6 C  Resp: 16    Level of consciousness: awake, alert  and patient cooperative  Complications: No apparent anesthesia complications

## 2015-01-09 NOTE — H&P (Signed)
[facility]  [address], [place], [place] [address] Phone: [phone] Fax : [phone]  Primary Care Physician:  Pcp Not In System Primary Gastroenterologist:  Dr. Allen Norris  Pre-Procedure History & Physical: HPI:  Dean Tanner is a 59 y.o. male is here for an endoscopy.   Past Medical History  Diagnosis Date  . Acid reflux 12/29/2011  . Kidney cysts 12/29/2011  . H/O alcohol abuse 12/29/2011  . Breathlessness on exertion 06/09/2014  . Hernia of anterior abdominal wall 12/29/2011  . COPD (chronic obstructive pulmonary disease)   . 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 Vella Kohler   . Hypertension     controlled on meds  . Benign essential HTN 02/12/2014  . Arteriosclerosis of coronary artery 12/29/2011    Overview:  Diffuse three vessel,mild    Past Surgical History  Procedure Laterality Date  . Cholecystectomy    . Laser benign laryngeal lesion    . Hernia repair    . Esophagogastroduodenoscopy  10/03/2011  . Colonoscopy  02/08/2013  . Cardiac catheterization      Prior to Admission medications   Medication Sig Start Date End Date Taking? Authorizing Provider  ADVAIR DISKUS 100-50 MCG/DOSE AEPB Inhale 1 puff into the lungs daily. Every other day 11/08/14  Yes Historical Provider, MD  aspirin EC 81 MG tablet Take 81 mg by mouth daily. am   Yes Historical Provider, MD  atenolol (TENORMIN) 25 MG tablet Take 12.5 mg by mouth daily. am   Yes Historical Provider, MD  atorvastatin (LIPITOR) 40 MG tablet Take 1 tablet by mouth daily. am 10/08/14  Yes Historical Provider, MD  cetirizine (ZYRTEC) 10 MG tablet Take 10 mg by mouth daily.   Yes Historical Provider, MD  dexlansoprazole (DEXILANT) 60 MG capsule Take 60 mg by mouth daily.   Yes Historical Provider, MD  fluticasone (FLONASE) 50 MCG/ACT nasal spray Place 2 sprays into both nostrils daily. am   Yes  Historical Provider, MD  lisinopril (PRINIVIL,ZESTRIL) 20 MG tablet Take by mouth. am 12/18/14  Yes Historical Provider, MD  Magnesium 250 MG TABS Take 1 tablet by mouth daily. am   Yes Historical Provider, MD  Multiple Vitamin (MULTIVITAMIN WITH MINERALS) TABS tablet Take 1 tablet by mouth daily. am   Yes Historical Provider, MD  nitroGLYCERIN (NITROSTAT) 0.4 MG SL tablet Place under the tongue every 5 (five) minutes as needed.  06/12/14  Yes Historical Provider, MD  potassium chloride SA (K-DUR,KLOR-CON) 20 MEQ tablet Take 1 tablet by mouth every other day. 03/31/14 02/26/15 Yes Historical Provider, MD  traZODone (DESYREL) 50 MG tablet Take by mouth at bedtime as needed.  12/18/14 12/18/15 Yes Historical Provider, MD  predniSONE (DELTASONE) 20 MG tablet Take 20 mg by mouth daily with breakfast.    Historical Provider, MD    Allergies as of 12/31/2014 - Review Complete 12/31/2014  Allergen Reaction Noted  . Codeine Other (See Comments) 11/25/2014    Family History  Problem Relation Age of Onset  . Coronary artery disease Father   . Colon polyps Father   . Gallbladder disease Father   . Coronary artery disease Mother   . Hypertension Mother   . Heart failure Mother   . Gallbladder disease Brother   . Colon polyps Brother     Social History   Social History  . Marital Status: Married    Spouse Name: N/A  .  Number of Children: N/A  . Years of Education: N/A   Occupational History  . Not on file.   Social History Main Topics  . Smoking status: Former Smoker -- 1.00 packs/day for 35 years  . Smokeless tobacco: Former Systems developer    Types: Snuff  . Alcohol Use: 0.0 oz/week    0 Standard drinks or equivalent per week     Comment: "several drinks a night"  . Drug Use: No  . Sexual Activity: Not on file   Other Topics Concern  . Not on file   Social History Narrative    Review of Systems: See HPI, otherwise negative ROS  Physical Exam: BP 154/92 mmHg  Pulse 63  Temp(Src) 97.9  F (36.6 C) (Oral)  Resp 16  Ht 5\' 8"  (1.727 m)  Wt 228 lb 8 oz (103.647 kg)  BMI 34.75 kg/m2  SpO2 100% General:   Alert,  pleasant and cooperative in NAD Head:  Normocephalic and atraumatic. Neck:  Supple; no masses or thyromegaly. Lungs:  Clear throughout to auscultation.    Heart:  Regular rate and rhythm. Abdomen:  Soft, nontender and nondistended. Normal bowel sounds, without guarding, and without rebound.   Neurologic:  Alert and  oriented x4;  grossly normal neurologically.  Impression/Plan: Dean Tanner is here for an endoscopy to be performed for abnormal CT and abd pain in the epigastric area.  Risks, benefits, limitations, and alternatives regarding  endoscopy have been reviewed with the patient.  Questions have been answered.  All parties agreeable.   Ollen Bowl, MD  01/09/2015, 7:54 AM

## 2015-01-09 NOTE — Transfer of Care (Signed)
Immediate Anesthesia Transfer of Care Note  Patient: Dean Tanner  Procedure(s) Performed: Procedure(s): ESOPHAGOGASTRODUODENOSCOPY (EGD) WITH PROPOFOL (N/A)  Patient Location: PACU  Anesthesia Type: MAC  Level of Consciousness: awake, alert  and patient cooperative  Airway and Oxygen Therapy: Patient Spontanous Breathing and Patient connected to supplemental oxygen  Post-op Assessment: Post-op Vital signs reviewed, Patient's Cardiovascular Status Stable, Respiratory Function Stable, Patent Airway and No signs of Nausea or vomiting  Post-op Vital Signs: Reviewed and stable  Complications: No apparent anesthesia complications

## 2015-01-09 NOTE — Op Note (Signed)
[facility] Patient Name: Dean Tanner Procedure Date: 01/09/2015 7:57 AM MRN: [id number] Account #: [id number] Date of Birth: 1956/02/02 Admit Type: Outpatient Age: 59 Room: [facility] OR ROOM 01 Gender: Male Note Status: Finalized Procedure:         Upper GI endoscopy Indications:       Epigastric abdominal pain, Abdominal pain in the right                     upper quadrant, Abnormal CT of the GI tract Providers:         Lucilla Lame, MD Referring MD:      Truitt Leep. Ingledue (Referring MD) Medicines:         Propofol per Anesthesia Complications:     No immediate complications. Procedure:         Pre-Anesthesia Assessment:                    - Prior to the procedure, a History and Physical was                     performed, and patient medications and allergies were                     reviewed. The patient's tolerance of previous anesthesia                     was also reviewed. The risks and benefits of the procedure                     and the sedation options and risks were discussed with the                     patient. All questions were answered, and informed consent                     was obtained. Prior Anticoagulants: The patient has taken                     no previous anticoagulant or antiplatelet agents. ASA                     Grade Assessment: II - A patient with mild systemic                     disease. After reviewing the risks and benefits, the                     patient was deemed in satisfactory condition to undergo                     the procedure.                    After obtaining informed consent, the endoscope was passed                     under direct vision. Throughout the procedure, the                     patient's blood pressure, pulse, and oxygen saturations                     were monitored continuously. The Olympus [id number]  Endoscope (S#. [id number]) was introduced through the mouth,               and advanced to the second part of duodenum. The upper GI                     endoscopy was accomplished without difficulty. The patient                     tolerated the procedure well. Findings:      The examined esophagus was normal.      Diffuse moderate inflammation characterized by granularity was found in       the entire examined stomach. Biopsies were taken with a cold forceps for       histology.      Localized mild inflammation characterized by erythema was found in the       second part of the duodenum. Biopsies were taken with a cold forceps for       histology. Impression:        - Normal esophagus.                    - Gastritis. Biopsied.                    - Duodenitis. Biopsied. Recommendation:    - Await pathology results. Procedure Code(s): --- Professional ---                    [phone], Esophagogastroduodenoscopy, flexible, transoral;                     with biopsy, single or multiple Diagnosis Code(s): --- Professional ---                    R93.3, Abnormal findings on diagnostic imaging of other                     parts of digestive tract                    R10.11, Right upper quadrant pain                    K29.80, Duodenitis without bleeding                    R10.13, Epigastric pain                    K29.70, Gastritis, unspecified, without bleeding CPT copyright 2014 American Medical Association. All rights reserved. The codes documented in this report are preliminary and upon coder review may  be revised to meet current compliance requirements. Lucilla Lame, MD 01/09/2015 8:10:53 AM This report has been signed electronically. Number of Addenda: 0 Note Initiated On: 01/09/2015 7:57 AM Total Procedure Duration: 0 hours 4 minutes 5 seconds       [facility]

## 2015-01-09 NOTE — Anesthesia Procedure Notes (Signed)
Procedure Name: MAC Performed by: LEBLANC, MONIQUE Pre-anesthesia Checklist: Patient identified, Emergency Drugs available, Suction available, Patient being monitored and Timeout performed Patient Re-evaluated:Patient Re-evaluated prior to inductionOxygen Delivery Method: Nasal cannula       

## 2015-01-09 NOTE — Anesthesia Preprocedure Evaluation (Signed)
Anesthesia Evaluation  Patient identified by MRN, date of birth, ID band Patient awake    Reviewed: Allergy & Precautions, H&P , NPO status , Patient's Chart, lab work & pertinent test results, reviewed documented beta blocker date and time   Airway Mallampati: III  TM Distance: >3 FB Neck ROM: full    Dental no notable dental hx.    Pulmonary shortness of breath and with exertion, COPD,  COPD inhaler, former smoker,    Pulmonary exam normal breath sounds clear to auscultation       Cardiovascular Exercise Tolerance: Good hypertension, On Home Beta Blockers + CAD   Rhythm:regular Rate:Normal     Neuro/Psych negative neurological ROS  negative psych ROS   GI/Hepatic Neg liver ROS, GERD  Medicated,  Endo/Other  negative endocrine ROS  Renal/GU Renal disease  negative genitourinary   Musculoskeletal   Abdominal   Peds  Hematology negative hematology ROS (+)   Anesthesia Other Findings   Reproductive/Obstetrics negative OB ROS                             Anesthesia Physical Anesthesia Plan  ASA: III  Anesthesia Plan: MAC   Post-op Pain Management:    Induction:   Airway Management Planned:   Additional Equipment:   Intra-op Plan:   Post-operative Plan:   Informed Consent: I have reviewed the patients History and Physical, chart, labs and discussed the procedure including the risks, benefits and alternatives for the proposed anesthesia with the patient or authorized representative who has indicated his/her understanding and acceptance.     Plan Discussed with: CRNA  Anesthesia Plan Comments:         Anesthesia Quick Evaluation

## 2015-01-12 ENCOUNTER — Encounter: Payer: Self-pay | Admitting: Gastroenterology

## 2015-01-13 ENCOUNTER — Encounter: Payer: Self-pay | Admitting: Gastroenterology

## 2016-02-11 ENCOUNTER — Encounter: Payer: Self-pay | Admitting: Emergency Medicine

## 2016-02-11 ENCOUNTER — Emergency Department
Admission: EM | Admit: 2016-02-11 | Discharge: 2016-02-11 | Disposition: A | Discharge status: Home / Self Care | Payer: Managed Care, Other (non HMO) | Attending: Emergency Medicine | Admitting: Emergency Medicine

## 2016-02-11 ENCOUNTER — Emergency Department: Payer: Managed Care, Other (non HMO)

## 2016-02-11 DIAGNOSIS — R410 Disorientation, unspecified: Secondary | ICD-10-CM

## 2016-02-11 DIAGNOSIS — Z87891 Personal history of nicotine dependence: Secondary | ICD-10-CM | POA: Insufficient documentation

## 2016-02-11 DIAGNOSIS — Z79899 Other long term (current) drug therapy: Secondary | ICD-10-CM | POA: Insufficient documentation

## 2016-02-11 DIAGNOSIS — R4182 Altered mental status, unspecified: Secondary | ICD-10-CM | POA: Diagnosis present

## 2016-02-11 DIAGNOSIS — J449 Chronic obstructive pulmonary disease, unspecified: Secondary | ICD-10-CM | POA: Diagnosis not present

## 2016-02-11 DIAGNOSIS — I10 Essential (primary) hypertension: Secondary | ICD-10-CM | POA: Diagnosis not present

## 2016-02-11 DIAGNOSIS — G459 Transient cerebral ischemic attack, unspecified: Secondary | ICD-10-CM | POA: Diagnosis not present

## 2016-02-11 DIAGNOSIS — I2510 Atherosclerotic heart disease of native coronary artery without angina pectoris: Secondary | ICD-10-CM | POA: Insufficient documentation

## 2016-02-11 DIAGNOSIS — Z7982 Long term (current) use of aspirin: Secondary | ICD-10-CM | POA: Insufficient documentation

## 2016-02-11 LAB — CBC
HEMATOCRIT: 44.2 % (ref 40.0–52.0)
Hemoglobin: 15.3 g/dL (ref 13.0–18.0)
MCH: 31.3 pg (ref 26.0–34.0)
MCHC: 34.5 g/dL (ref 32.0–36.0)
MCV: 90.7 fL (ref 80.0–100.0)
PLATELETS: 161 10*3/uL (ref 150–440)
RBC: 4.87 MIL/uL (ref 4.40–5.90)
RDW: 14 % (ref 11.5–14.5)
WBC: 8.1 10*3/uL (ref 3.8–10.6)

## 2016-02-11 LAB — COMPREHENSIVE METABOLIC PANEL
ALT: 21 U/L (ref 17–63)
AST: 20 U/L (ref 15–41)
Albumin: 4.3 g/dL (ref 3.5–5.0)
Alkaline Phosphatase: 67 U/L (ref 38–126)
Anion gap: 5 (ref 5–15)
BILIRUBIN TOTAL: 1 mg/dL (ref 0.3–1.2)
BUN: 21 mg/dL — AB (ref 6–20)
CO2: 28 mmol/L (ref 22–32)
CREATININE: 0.97 mg/dL (ref 0.61–1.24)
Calcium: 9.5 mg/dL (ref 8.9–10.3)
Chloride: 105 mmol/L (ref 101–111)
GFR calc Af Amer: >60 mL/min (ref >60)
Glucose, Bld: 105 mg/dL — ABNORMAL HIGH (ref 65–99)
POTASSIUM: 3.9 mmol/L (ref 3.5–5.1)
Sodium: 138 mmol/L (ref 135–145)
TOTAL PROTEIN: 7.4 g/dL (ref 6.5–8.1)

## 2016-02-11 LAB — TROPONIN I: Troponin I: <0.03 ng/mL (ref <0.03)

## 2016-02-11 NOTE — ED Triage Notes (Addendum)
Pt presents with headache, some confusion and is currently under a lot of stress at the moment. Pt states he was fine this am and even worked from home some. Equal hand grips and face symmetrical at triage. No slurred speech noted.  Pt states he did take his bp med this am but BP continues to be elevated. Pt very tearful at this time. States is going through some tough times at home and thinks this is related to stress.

## 2016-02-11 NOTE — Discharge Instructions (Signed)
As we discussed her workup today in the emergency department is normal including an MRI of the brain. Please follow-up with her primary care doctor for further evaluation as soon as possible. We would recommend that you follow up with her primary care doctor to arrange carotid ultrasounds as well as an echocardiogram of the heart. Return to the emergency department for any headache, slurred speech, confusion, weakness/numbness of any arm or leg.

## 2016-02-11 NOTE — ED Provider Notes (Signed)
[facility] Emergency Department Provider Note  Time seen: 1:59 PM  I have reviewed the triage vital signs and the nursing notes.   HISTORY  Chief Complaint Headache; Altered Mental Status; and Stress    HPI Dean Tanner is a 60 y.o. male with a past medical history of hypertension, COPD, but dictations, who presents to the emergency department with confusion. According to the patient approximately 2 hours ago the patient's daughter called him and he was very confused. Did not remember that his wife was in the hospital could not recall his medications. Patient states he fell very confused he remembers feeling very confused. Denies any history of the same. Denies any slurred speech, weakness or numbness. Daughter is here with the patient is states he is back to normal. Patient does admit he is under a lot of stress recently, his wife has stage IV cancer and is currently hospitalized at [facility].  Past Medical History:  Diagnosis Date  . Acid reflux 12/29/2011  . Arteriosclerosis of coronary artery 12/29/2011   Overview:  Diffuse three vessel,mild  . Benign essential HTN 02/12/2014  . Breathlessness on exertion 06/09/2014  . CAFL (chronic airflow limitation) ([place]) 04/30/2013   Overview:  Spirometry 12/14 FVC 89% FEV1 79% FEV1/FVC 66% c/w moderate obstructive disease Dr. Vella Kohler   . COPD (chronic obstructive pulmonary disease) ([place])   . H/O alcohol abuse 12/29/2011  . Hernia of anterior abdominal wall 12/29/2011  . Hypertension    controlled on meds  . Kidney cysts 12/29/2011    Patient Active Problem List   Diagnosis Date Noted  . Abnormal findings-gastrointestinal tract   . Abdominal pain, right upper quadrant   . Duodenitis   . Abdominal pain, epigastric   . Gastritis   . Awareness of heartbeats 07/22/2014  . Breathlessness on exertion 06/09/2014  . 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 Benign essential HTN 02/12/2014  .  Ankle edema 01/09/2014  . CAFL (chronic airflow limitation) ([place]) 04/30/2013  . Acute bronchospasm 04/02/2013  . Arteriosclerosis of coronary artery 12/29/2011  . Acid reflux 12/29/2011  . H/O alcohol abuse 12/29/2011  . Kidney cysts 12/29/2011  . Diagnostic skin and sensitization tests 12/29/2011  . Prenatal consult 12/29/2011  . Hernia of anterior abdominal wall 12/29/2011    Past Surgical History:  Procedure Laterality Date  . CARDIAC CATHETERIZATION    . CHOLECYSTECTOMY    . COLONOSCOPY  02/08/2013  . ESOPHAGOGASTRODUODENOSCOPY  10/03/2011  . ESOPHAGOGASTRODUODENOSCOPY (EGD) WITH PROPOFOL N/A 01/09/2015   Procedure: ESOPHAGOGASTRODUODENOSCOPY (EGD) WITH PROPOFOL;  Surgeon: Lucilla Lame, MD;  Location: [place];  Service: Endoscopy;  Laterality: N/A;  . HERNIA REPAIR    . laser benign laryngeal lesion      Prior to Admission medications   Medication Sig Start Date End Date Taking? Authorizing Provider  ADVAIR DISKUS 100-50 MCG/DOSE AEPB Inhale 1 puff into the lungs daily. Every other day 11/08/14   Historical Provider, MD  aspirin EC 81 MG tablet Take 81 mg by mouth daily. am    Historical Provider, MD  atenolol (TENORMIN) 25 MG tablet Take 12.5 mg by mouth daily. am    Historical Provider, MD  atorvastatin (LIPITOR) 40 MG tablet Take 1 tablet by mouth daily. am 10/08/14   Historical Provider, MD  cetirizine (ZYRTEC) 10 MG tablet Take 10 mg by mouth daily.    Historical Provider, MD  dexlansoprazole (DEXILANT) 60 MG capsule Take 60 mg by mouth daily.    Historical  Provider, MD  fluticasone (FLONASE) 50 MCG/ACT nasal spray Place 2 sprays into both nostrils daily. am    Historical Provider, MD  lisinopril (PRINIVIL,ZESTRIL) 20 MG tablet Take by mouth. am 12/18/14   Historical Provider, MD  Magnesium 250 MG TABS Take 1 tablet by mouth daily. am    Historical Provider, MD  Multiple Vitamin (MULTIVITAMIN WITH MINERALS) TABS tablet Take 1 tablet by mouth daily. am    Historical  Provider, MD  nitroGLYCERIN (NITROSTAT) 0.4 MG SL tablet Place under the tongue every 5 (five) minutes as needed.  06/12/14   Historical Provider, MD  potassium chloride SA (K-DUR,KLOR-CON) 20 MEQ tablet Take 1 tablet by mouth every other day. 03/31/14 02/26/15  Historical Provider, MD  predniSONE (DELTASONE) 20 MG tablet Take 20 mg by mouth daily with breakfast.    Historical Provider, MD  traZODone (DESYREL) 50 MG tablet Take by mouth at bedtime as needed.  12/18/14 12/18/15  Historical Provider, MD    Allergies  Allergen Reactions  . Codeine Other (See Comments)    Reaction: "messes up his intestinal tract" so he doesn't like taking it.     Family History  Problem Relation Age of Onset  . Coronary artery disease Father   . Colon polyps Father   . Gallbladder disease Father   . Coronary artery disease Mother   . Hypertension Mother   . Heart failure Mother   . Gallbladder disease Brother   . Colon polyps Brother     Social History Social History  Substance Use Topics  . Smoking status: Former Smoker    Packs/day: 1.00    Years: 35.00  . Smokeless tobacco: Former Systems developer    Types: Snuff  . Alcohol use 0.0 oz/week     Comment: "several drinks a night"    Review of Systems Constitutional: Negative for fever. Cardiovascular: Negative for chest pain.Patient states frequent palpitations which is being worked up by his cardiologist. Respiratory: Negative for shortness of breath. Gastrointestinal: Negative for abdominal pain Musculoskeletal: Negative for back pain. Neurological: Negative for headaches, focal weakness or numbness. 10-point ROS otherwise negative.  ____________________________________________   PHYSICAL EXAM:  VITAL SIGNS: ED Triage Vitals [02/11/16 1303]  Enc Vitals Group     BP (!) 177/97     Pulse Rate 87     Resp 18     Temp 98.3 F (36.8 C)     Temp Source Oral     SpO2 99 %     Weight 230 lb (104.3 kg)     Height 5\' 8"  (1.727 m)     Head  Circumference      Peak Flow      Pain Score 1     Pain Loc      Pain Edu?      Excl. in [place]?     Constitutional: Alert and oriented. Well appearing and in no distress. Eyes: Normal exam ENT   Head: Normocephalic and atraumatic.   Mouth/Throat: Mucous membranes are moist. Cardiovascular: Normal rate, regular rhythm.Frequent ectopic beats auscultated. Respiratory: Normal respiratory effort without tachypnea nor retractions. Breath sounds are clear  Gastrointestinal: Soft and nontender. No distention.  Musculoskeletal: Nontender with normal range of motion in all extremities.  Neurologic:  Normal speech and language. No gross focal neurologic deficits. Equal grip strengths. No pronator drift. No lower extremity drift. Finger to nose testing intact. 5/5 motor in all extremities with sensation intact. Cranial nerves intact Skin:  Skin is warm, dry and intact.  Psychiatric:  Mood and affect are normal. Speech and behavior are normal.   ____________________________________________    EKG  EKG reviewed and interpreted by myself shows normal sinus rhythm at 81 bpm, narrow QS, normal axis, normal intervals, nonspecific ST changes with occasional PVC.  ____________________________________________    RADIOLOGY  CT head negative for intracranial abnormality.  ____________________________________________   INITIAL IMPRESSION / ASSESSMENT AND PLAN / ED COURSE  Pertinent labs & imaging results that were available during my care of the patient were reviewed by me and considered in my medical decision making (see chart for details).  The patient presents the emergency department with an episode of confusion lasting approximately 30 minutes. Patient's daughter is here with him he states he is back to normal. No history of CVA or TIA in the past. Patient is under a lot of stress which he relates to today's symptoms. He does have frequent palpitations, frequent ectopic beats auscultated  during the exam. We will check labs, CT head and closely monitor in the emergency department. Symptoms are very suggestive of TIA.  CT scan of the head is nonrevealing, labs are within normal limits. Given my suspicion for a TIA and discussed with the patient admission to the hospital for further workup. Patient would prefer to go home, however he is agreeable to MRI in the emergency department. If the MRI is normal the patient will follow up with his primary care doctor to arrange an outpatient carotid ultrasound and echocardiogram. If the MRI is positive patient will be admitted for further workup.  MRI of the brain is negative. Patient will be discharged home with PCP follow-up. I discussed strict return precautions, patient is agreeable.  ____________________________________________   FINAL CLINICAL IMPRESSION(S) / ED DIAGNOSES  TIA    Harvest Dark, MD 02/11/16 1547

## 2017-05-25 ENCOUNTER — Other Ambulatory Visit: Payer: Self-pay | Admitting: Family Medicine

## 2017-05-25 ENCOUNTER — Ambulatory Visit: Payer: Managed Care, Other (non HMO)

## 2017-05-25 DIAGNOSIS — R1084 Generalized abdominal pain: Secondary | ICD-10-CM

## 2018-06-28 ENCOUNTER — Encounter: Payer: Self-pay | Admitting: *Deleted

## 2018-06-28 ENCOUNTER — Other Ambulatory Visit: Payer: Self-pay

## 2018-06-28 ENCOUNTER — Observation Stay
Admission: EM | Admit: 2018-06-28 | Discharge: 2018-06-29 | Disposition: A | Discharge status: Home / Self Care | Payer: Medicare Other | Attending: Internal Medicine | Admitting: Internal Medicine

## 2018-06-28 DIAGNOSIS — E876 Hypokalemia: Secondary | ICD-10-CM | POA: Diagnosis present

## 2018-06-28 DIAGNOSIS — Z7951 Long term (current) use of inhaled steroids: Secondary | ICD-10-CM | POA: Diagnosis not present

## 2018-06-28 DIAGNOSIS — Z7952 Long term (current) use of systemic steroids: Secondary | ICD-10-CM | POA: Insufficient documentation

## 2018-06-28 DIAGNOSIS — E7801 Familial hypercholesterolemia: Secondary | ICD-10-CM | POA: Insufficient documentation

## 2018-06-28 DIAGNOSIS — Z79899 Other long term (current) drug therapy: Secondary | ICD-10-CM | POA: Insufficient documentation

## 2018-06-28 DIAGNOSIS — I2510 Atherosclerotic heart disease of native coronary artery without angina pectoris: Secondary | ICD-10-CM | POA: Insufficient documentation

## 2018-06-28 DIAGNOSIS — Z87891 Personal history of nicotine dependence: Secondary | ICD-10-CM | POA: Diagnosis not present

## 2018-06-28 DIAGNOSIS — K219 Gastro-esophageal reflux disease without esophagitis: Secondary | ICD-10-CM | POA: Diagnosis present

## 2018-06-28 DIAGNOSIS — Z7982 Long term (current) use of aspirin: Secondary | ICD-10-CM | POA: Diagnosis not present

## 2018-06-28 DIAGNOSIS — I10 Essential (primary) hypertension: Secondary | ICD-10-CM | POA: Diagnosis present

## 2018-06-28 DIAGNOSIS — Z8249 Family history of ischemic heart disease and other diseases of the circulatory system: Secondary | ICD-10-CM | POA: Insufficient documentation

## 2018-06-28 DIAGNOSIS — E785 Hyperlipidemia, unspecified: Secondary | ICD-10-CM | POA: Diagnosis not present

## 2018-06-28 DIAGNOSIS — J449 Chronic obstructive pulmonary disease, unspecified: Secondary | ICD-10-CM | POA: Diagnosis not present

## 2018-06-28 DIAGNOSIS — K529 Noninfective gastroenteritis and colitis, unspecified: Secondary | ICD-10-CM | POA: Diagnosis present

## 2018-06-28 LAB — MAGNESIUM: Magnesium: 0.7 mg/dL — CL (ref 1.7–2.4)

## 2018-06-28 LAB — COMPREHENSIVE METABOLIC PANEL
ALK PHOS: 64 U/L (ref 38–126)
ALT: 20 U/L (ref 0–44)
ANION GAP: 7 (ref 5–15)
AST: 17 U/L (ref 15–41)
Albumin: 4 g/dL (ref 3.5–5.0)
BILIRUBIN TOTAL: 0.6 mg/dL (ref 0.3–1.2)
BUN: 15 mg/dL (ref 8–23)
CO2: 31 mmol/L (ref 22–32)
Calcium: 8.1 mg/dL — ABNORMAL LOW (ref 8.9–10.3)
Chloride: 103 mmol/L (ref 98–111)
Creatinine, Ser: 1.02 mg/dL (ref 0.61–1.24)
GFR calc non Af Amer: >60 mL/min (ref >60)
GLUCOSE: 117 mg/dL — AB (ref 70–99)
POTASSIUM: 2.9 mmol/L — AB (ref 3.5–5.1)
Sodium: 141 mmol/L (ref 135–145)
TOTAL PROTEIN: 6.3 g/dL — AB (ref 6.5–8.1)

## 2018-06-28 LAB — URINALYSIS, COMPLETE (UACMP) WITH MICROSCOPIC
Bacteria, UA: NONE SEEN
Bilirubin Urine: NEGATIVE
GLUCOSE, UA: NEGATIVE mg/dL
HGB URINE DIPSTICK: NEGATIVE
KETONES UR: NEGATIVE mg/dL
Leukocytes,Ua: NEGATIVE
NITRITE: NEGATIVE
PH: 7 (ref 5.0–8.0)
PROTEIN: NEGATIVE mg/dL
Specific Gravity, Urine: 1.005 (ref 1.005–1.030)
Squamous Epithelial / LPF: NONE SEEN (ref 0–5)
WBC, UA: NONE SEEN WBC/hpf (ref 0–5)

## 2018-06-28 LAB — CBC
HCT: 38.6 % — ABNORMAL LOW (ref 39.0–52.0)
Hemoglobin: 13 g/dL (ref 13.0–17.0)
MCH: 29.9 pg (ref 26.0–34.0)
MCHC: 33.7 g/dL (ref 30.0–36.0)
MCV: 88.7 fL (ref 80.0–100.0)
NRBC: 0 % (ref 0.0–0.2)
PLATELETS: 204 10*3/uL (ref 150–400)
RBC: 4.35 MIL/uL (ref 4.22–5.81)
RDW: 13.2 % (ref 11.5–15.5)
WBC: 10 10*3/uL (ref 4.0–10.5)

## 2018-06-28 LAB — LIPASE, BLOOD: LIPASE: 32 U/L (ref 11–51)

## 2018-06-28 MED ORDER — POTASSIUM CHLORIDE CRYS ER 20 MEQ PO TBCR
40.0000 meq | EXTENDED_RELEASE_TABLET | Freq: Once | ORAL | Status: AC
  Administered 2018-06-29: 40 meq via ORAL
  Filled 2018-06-28: qty 2

## 2018-06-28 MED ORDER — MAGNESIUM SULFATE 2 GM/50ML IV SOLN
2.0000 g | Freq: Once | INTRAVENOUS | Status: AC
  Administered 2018-06-28: 2 g via INTRAVENOUS
  Filled 2018-06-28: qty 50

## 2018-06-28 MED ORDER — SODIUM CHLORIDE 0.9% FLUSH
3.0000 mL | Freq: Once | INTRAVENOUS | Status: AC
  Administered 2018-06-29: 03:00:00 3 mL via INTRAVENOUS

## 2018-06-28 MED ORDER — MAGNESIUM OXIDE 400 (241.3 MG) MG PO TABS
400.0000 mg | ORAL_TABLET | Freq: Every day | ORAL | Status: DC
  Administered 2018-06-28 – 2018-06-29 (×2): 400 mg via ORAL
  Filled 2018-06-28 (×2): qty 1

## 2018-06-28 NOTE — ED Provider Notes (Signed)
[facility] Emergency Department Provider Note    First MD Initiated Contact with Patient 06/28/18 2257     (approximate)  I have reviewed the triage vital signs and the nursing notes.   HISTORY  Chief Complaint Abnormal Labs    HPI Dean Tanner is a 63 y.o. male resents to the ER for evaluation of abnormal blood work.  Patient has evidence of low magnesium is obtained from PCP office.  States he has been noticing some weakness and intermittent muscle twitches.  Has been having episodes of this in the past.  Has been having chronic diarrhea of uncertain etiology.  Takes several doses of mag oxide which may be contributing to his symptoms.  Denies any pain at this time.    Past Medical History:  Diagnosis Date  . Acid reflux 12/29/2011  . Arteriosclerosis of coronary artery 12/29/2011   Overview:  Diffuse three vessel,mild  . Benign essential HTN 02/12/2014  . Breathlessness on exertion 06/09/2014  . CAFL (chronic airflow limitation) ([place]) 04/30/2013   Overview:  Spirometry 12/14 FVC 89% FEV1 79% FEV1/FVC 66% c/w moderate obstructive disease Dr. Vella Kohler   . COPD (chronic obstructive pulmonary disease) ([place])   . H/O alcohol abuse 12/29/2011  . Hernia of anterior abdominal wall 12/29/2011  . Hypertension    controlled on meds  . Kidney cysts 12/29/2011   Family History  Problem Relation Age of Onset  . Coronary artery disease Father   . Colon polyps Father   . Gallbladder disease Father   . Coronary artery disease Mother   . Hypertension Mother   . Heart failure Mother   . Gallbladder disease Brother   . Colon polyps Brother    Past Surgical History:  Procedure Laterality Date  . CARDIAC CATHETERIZATION    . CHOLECYSTECTOMY    . COLONOSCOPY  02/08/2013  . ESOPHAGOGASTRODUODENOSCOPY  10/03/2011  . ESOPHAGOGASTRODUODENOSCOPY (EGD) WITH PROPOFOL N/A 01/09/2015   Procedure: ESOPHAGOGASTRODUODENOSCOPY (EGD) WITH PROPOFOL;  Surgeon: Lucilla Lame, MD;   Location: [place];  Service: Endoscopy;  Laterality: N/A;  . HERNIA REPAIR    . laser benign laryngeal lesion     Patient Active Problem List   Diagnosis Date Noted  . Chronic diarrhea 06/28/2018  . Hypomagnesemia 06/28/2018  . Abnormal findings-gastrointestinal tract   . Abdominal pain, right upper quadrant   . Duodenitis   . Abdominal pain, epigastric   . Gastritis   . Awareness of heartbeats 07/22/2014  . Breathlessness on exertion 06/09/2014  . 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 Benign essential HTN 02/12/2014  . Ankle edema 01/09/2014  . CAFL (chronic airflow limitation) ([place]) 04/30/2013  . Acute bronchospasm 04/02/2013  . Arteriosclerosis of coronary artery 12/29/2011  . Acid reflux 12/29/2011  . H/O alcohol abuse 12/29/2011  . Kidney cysts 12/29/2011  . Diagnostic skin and sensitization tests 12/29/2011  . Prenatal consult 12/29/2011  . Hernia of anterior abdominal wall 12/29/2011      Prior to Admission medications   Medication Sig Start Date End Date Taking? Authorizing Provider  ADVAIR DISKUS 100-50 MCG/DOSE AEPB Inhale 1 puff into the lungs daily. Every other day 11/08/14   [provider]  atorvastatin (LIPITOR) 40 MG tablet Take 1 tablet by mouth daily. am 10/08/14   [provider]  cetirizine (ZYRTEC) 10 MG tablet Take 10 mg by mouth daily.    [provider]  lisinopril (PRINIVIL,ZESTRIL) 20 MG tablet Take by mouth. am 12/18/14   [provider]  Multiple Vitamin (MULTIVITAMIN WITH MINERALS) TABS tablet Take 1 tablet by mouth daily. am    [provider]  nitroGLYCERIN (NITROSTAT) 0.4 MG SL tablet Place under the tongue every 5 (five) minutes as needed.  06/12/14   [provider]  potassium chloride SA (K-DUR,KLOR-CON) 20 MEQ tablet Take 1 tablet by mouth every other day. 03/31/14 02/26/15  [provider]    Allergies Codeine    Social History Social  History   Tobacco Use  . Smoking status: Former Smoker    Packs/day: 1.00    Years: 35.00    Pack years: 35.00  . Smokeless tobacco: Former Systems developer    Types: Snuff  Substance Use Topics  . Alcohol use: Yes    Alcohol/week: 0.0 standard drinks  . Drug use: No    Review of Systems Patient denies headaches, rhinorrhea, blurry vision, numbness, shortness of breath, chest pain, edema, cough, abdominal pain, nausea, vomiting, diarrhea, dysuria, fevers, rashes or hallucinations unless otherwise stated above in HPI. ____________________________________________   PHYSICAL EXAM:  VITAL SIGNS: Vitals:   06/28/18 2044 06/28/18 2150  BP: (!) 171/79 (!) 155/74  Pulse: 71 (!) 56  Resp: 16 17  Temp: 98.3 F (36.8 C)   SpO2: 96% 96%    Constitutional: Alert and oriented.  Eyes: Conjunctivae are normal.  Head: Atraumatic. Nose: No congestion/rhinnorhea. Mouth/Throat: Mucous membranes are moist.   Neck: No stridor. Painless ROM.  Cardiovascular: Normal rate, regular rhythm. Grossly normal heart sounds.  Good peripheral circulation. Respiratory: Normal respiratory effort.  No retractions. Lungs CTAB. Gastrointestinal: Soft and nontender. No distention. No abdominal bruits. No CVA tenderness. Genitourinary:  Musculoskeletal: No lower extremity tenderness nor edema.  No joint effusions. Neurologic:  Normal speech and language. No gross focal neurologic deficits are appreciated. No facial droop.  ++ patellar reflexes Skin:  Skin is warm, dry and intact. No rash noted. Psychiatric: Mood and affect are normal. Speech and behavior are normal.  ____________________________________________   LABS (all labs ordered are listed, but only abnormal results are displayed)  Results for orders placed or performed during the hospital encounter of 06/28/18 (from the past 24 hour(s))  Lipase, blood     Status: None   Collection Time: 06/28/18  8:56 PM  Result Value Ref Range   Lipase 32 11 - 51 U/L    Comprehensive metabolic panel     Status: Abnormal   Collection Time: 06/28/18  8:56 PM  Result Value Ref Range   Sodium 141 135 - 145 mmol/L   Potassium 2.9 (L) 3.5 - 5.1 mmol/L   Chloride 103 98 - 111 mmol/L   CO2 31 22 - 32 mmol/L   Glucose, Bld 117 (H) 70 - 99 mg/dL   BUN 15 8 - 23 mg/dL   Creatinine, Ser 1.02 0.61 - 1.24 mg/dL   Calcium 8.1 (L) 8.9 - 10.3 mg/dL   Total Protein 6.3 (L) 6.5 - 8.1 g/dL   Albumin 4.0 3.5 - 5.0 g/dL   AST 17 15 - 41 U/L   ALT 20 0 - 44 U/L   Alkaline Phosphatase 64 38 - 126 U/L   Total Bilirubin 0.6 0.3 - 1.2 mg/dL   GFR calc non Af Amer >60 >60 mL/min   GFR calc Af Amer >60 >60 mL/min   Anion gap 7 5 - 15  CBC     Status: Abnormal   Collection Time: 06/28/18  8:56 PM  Result Value Ref Range   WBC 10.0 4.0 -  10.5 K/uL   RBC 4.35 4.22 - 5.81 MIL/uL   Hemoglobin 13.0 13.0 - 17.0 g/dL   HCT 38.6 (L) 39.0 - 52.0 %   MCV 88.7 80.0 - 100.0 fL   MCH 29.9 26.0 - 34.0 pg   MCHC 33.7 30.0 - 36.0 g/dL   RDW 13.2 11.5 - 15.5 %   Platelets 204 150 - 400 K/uL   nRBC 0.0 0.0 - 0.2 %  Urinalysis, Complete w Microscopic     Status: Abnormal   Collection Time: 06/28/18  8:56 PM  Result Value Ref Range   Color, Urine COLORLESS (A) YELLOW   APPearance CLEAR (A) CLEAR   Specific Gravity, Urine 1.005 1.005 - 1.030   pH 7.0 5.0 - 8.0   Glucose, UA NEGATIVE NEGATIVE mg/dL   Hgb urine dipstick NEGATIVE NEGATIVE   Bilirubin Urine NEGATIVE NEGATIVE   Ketones, ur NEGATIVE NEGATIVE mg/dL   Protein, ur NEGATIVE NEGATIVE mg/dL   Nitrite NEGATIVE NEGATIVE   Leukocytes,Ua NEGATIVE NEGATIVE   RBC / HPF 0-5 0 - 5 RBC/hpf   WBC, UA NONE SEEN 0 - 5 WBC/hpf   Bacteria, UA NONE SEEN NONE SEEN   Squamous Epithelial / LPF NONE SEEN 0 - 5   Mucus PRESENT   Magnesium     Status: Abnormal   Collection Time: 06/28/18  8:56 PM  Result Value Ref Range   Magnesium 0.7 (LL) 1.7 - 2.4 mg/dL   ____________________________________________  EKG My review and personal  interpretation at Time: 20:53   Indication: low mag  Rate: 70  Rhythm: sinus Axis: normal Other: normal intervals, no stemi ____________________________________________  RADIOLOGY  ____________________________________________   PROCEDURES  Procedure(s) performed:  .Critical Care Performed by: Merlyn Lot, MD Authorized by: Merlyn Lot, MD   Critical care provider statement:    Critical care time (minutes):  30   Critical care time was exclusive of:  Separately billable procedures and treating other patients   Critical care was necessary to treat or prevent imminent or life-threatening deterioration of the following conditions:  Metabolic crisis   Critical care was time spent personally by me on the following activities:  Development of treatment plan with patient or surrogate, discussions with consultants, evaluation of patient's response to treatment, examination of patient, obtaining history from patient or surrogate, ordering and performing treatments and interventions, ordering and review of laboratory studies, ordering and review of radiographic studies, pulse oximetry, re-evaluation of patient's condition and review of old charts      Critical Care performed: yes ____________________________________________   INITIAL IMPRESSION / ASSESSMENT AND PLAN / ED COURSE  Pertinent labs & imaging results that were available during my care of the patient were reviewed by me and considered in my medical decision making (see chart for details).   DDX: Slight abnormality, GI losses, medication complication  MALEEK CRAVER is a 63 y.o. who presents to the ED with evidence of severe hypomagnesemia as well as hypokalemia.  Does not have EKG changes at this time.  Is having symptomatic hypo-mag.  Will give IV magnesium given the extent of how low it is will require admission the hospital for replacement.      As part of my medical decision making, I reviewed the following  data within the [place] notes reviewed and incorporated, Labs reviewed, notes from prior ED visits and [place] Controlled Substance Database   ____________________________________________   FINAL CLINICAL IMPRESSION(S) / ED DIAGNOSES  Final diagnoses:  Hypomagnesemia  Hypokalemia  NEW MEDICATIONS STARTED DURING THIS VISIT:  New Prescriptions   No medications on file     Note:  This document was prepared using Dragon voice recognition software and may include unintentional dictation errors.    Merlyn Lot, MD 06/28/18 [phone]

## 2018-06-28 NOTE — H&P (Signed)
[facility] NAME: Dean Tanner    MR#:  [id number]  DATE OF BIRTH:  [birth year]  DATE OF ADMISSION:  06/28/2018  PRIMARY CARE PHYSICIAN: System, Pcp Not In   REQUESTING/REFERRING PHYSICIAN: Quentin Cornwall, MD  CHIEF COMPLAINT:   Chief Complaint  Patient presents with  . Abnormal Labs    HISTORY OF PRESENT ILLNESS:  Dean Tanner  is a 63 y.o. male who presents with chief complaint as above.  Patient has chronic diarrhea and has had difficulty with electrolyte abnormalities in the past to the same.  He was called today from his physician's office to the fact that his magnesium was very low.  Here in the ED on recheck it was 0.7.  Hospitalist were called for admission for electrolyte correction  PAST MEDICAL HISTORY:   Past Medical History:  Diagnosis Date  . Acid reflux 12/29/2011  . Arteriosclerosis of coronary artery 12/29/2011   Overview:  Diffuse three vessel,mild  . Benign essential HTN 02/12/2014  . Breathlessness on exertion 06/09/2014  . CAFL (chronic airflow limitation) ([place]) 04/30/2013   Overview:  Spirometry 12/14 FVC 89% FEV1 79% FEV1/FVC 66% c/w moderate obstructive disease Dr. Vella Kohler   . COPD (chronic obstructive pulmonary disease) ([place])   . H/O alcohol abuse 12/29/2011  . Hernia of anterior abdominal wall 12/29/2011  . Hypertension    controlled on meds  . Kidney cysts 12/29/2011     PAST SURGICAL HISTORY:   Past Surgical History:  Procedure Laterality Date  . CARDIAC CATHETERIZATION    . CHOLECYSTECTOMY    . COLONOSCOPY  02/08/2013  . ESOPHAGOGASTRODUODENOSCOPY  10/03/2011  . ESOPHAGOGASTRODUODENOSCOPY (EGD) WITH PROPOFOL N/A 01/09/2015   Procedure: ESOPHAGOGASTRODUODENOSCOPY (EGD) WITH PROPOFOL;  Surgeon: Lucilla Lame, MD;  Location: [place];  Service: Endoscopy;  Laterality: N/A;  . HERNIA REPAIR    . laser benign laryngeal lesion       SOCIAL HISTORY:   Social History   Tobacco Use  .  Smoking status: Former Smoker    Packs/day: 1.00    Years: 35.00    Pack years: 35.00  . Smokeless tobacco: Former Systems developer    Types: Snuff  Substance Use Topics  . Alcohol use: Yes    Alcohol/week: 0.0 standard drinks     FAMILY HISTORY:   Family History  Problem Relation Age of Onset  . Coronary artery disease Father   . Colon polyps Father   . Gallbladder disease Father   . Coronary artery disease Mother   . Hypertension Mother   . Heart failure Mother   . Gallbladder disease Brother   . Colon polyps Brother     Family history reviewed and is non-contributory DRUG ALLERGIES:   Allergies  Allergen Reactions  . Codeine Other (See Comments)    Reaction: "messes up his intestinal tract" so he doesn't like taking it.     MEDICATIONS AT HOME:   Prior to Admission medications   Medication Sig Start Date End Date Taking? Authorizing Provider  ADVAIR DISKUS 100-50 MCG/DOSE AEPB Inhale 1 puff into the lungs daily. Every other day 11/08/14   [provider]  aspirin EC 81 MG tablet Take 81 mg by mouth daily. am    [provider]  atenolol (TENORMIN) 25 MG tablet Take 12.5 mg by mouth daily. am    [provider]  atorvastatin (LIPITOR) 40 MG tablet Take 1 tablet by mouth daily. am 10/08/14   [provider]  cetirizine (ZYRTEC) 10 MG tablet Take 10 mg by mouth daily.    [provider]  dexlansoprazole (DEXILANT) 60 MG capsule Take 60 mg by mouth daily.    [provider]  fluticasone (FLONASE) 50 MCG/ACT nasal spray Place 2 sprays into both nostrils daily. am    [provider]  lisinopril (PRINIVIL,ZESTRIL) 20 MG tablet Take by mouth. am 12/18/14   [provider]  Magnesium 250 MG TABS Take 1 tablet by mouth daily. am    [provider]  Multiple Vitamin (MULTIVITAMIN WITH MINERALS) TABS tablet Take 1 tablet by mouth daily. am    [provider]  nitroGLYCERIN (NITROSTAT) 0.4 MG SL tablet  Place under the tongue every 5 (five) minutes as needed.  06/12/14   [provider]  potassium chloride SA (K-DUR,KLOR-CON) 20 MEQ tablet Take 1 tablet by mouth every other day. 03/31/14 02/26/15  [provider]  predniSONE (DELTASONE) 20 MG tablet Take 20 mg by mouth daily with breakfast.    [provider]  traZODone (DESYREL) 50 MG tablet Take by mouth at bedtime as needed.  12/18/14 12/18/15  [provider]    REVIEW OF SYSTEMS:  Review of Systems  Constitutional: Negative for chills, fever, malaise/fatigue and weight loss.  HENT: Negative for ear pain, hearing loss and tinnitus.   Eyes: Negative for blurred vision, double vision, pain and redness.  Respiratory: Negative for cough, hemoptysis and shortness of breath.   Cardiovascular: Negative for chest pain, palpitations, orthopnea and leg swelling.  Gastrointestinal: Positive for diarrhea. Negative for abdominal pain, constipation, nausea and vomiting.  Genitourinary: Negative for dysuria, frequency and hematuria.  Musculoskeletal: Negative for back pain, joint pain and neck pain.  Skin:       No acne, rash, or lesions  Neurological: Negative for dizziness, tremors, focal weakness and weakness.  Endo/Heme/Allergies: Negative for polydipsia. Does not bruise/bleed easily.  Psychiatric/Behavioral: Negative for depression. The patient is not nervous/anxious and does not have insomnia.      VITAL SIGNS:   Vitals:   06/28/18 2044 06/28/18 2150  BP: (!) 171/79 (!) 155/74  Pulse: 71 (!) 56  Resp: 16 17  Temp: 98.3 F (36.8 C)   TempSrc: Oral   SpO2: 96% 96%  Weight: 104.3 kg   Height: 5\' 8"  (1.727 m)    Wt Readings from Last 3 Encounters:  06/28/18 104.3 kg  02/11/16 104.3 kg  01/09/15 103.6 kg    PHYSICAL EXAMINATION:  Physical Exam  Vitals reviewed. Constitutional: He is oriented to person, place, and time. He appears well-developed and well-nourished. No distress.  HENT:  Head:  Normocephalic and atraumatic.  Mouth/Throat: Oropharynx is clear and moist.  Eyes: Pupils are equal, round, and reactive to light. Conjunctivae and EOM are normal. No scleral icterus.  Neck: Normal range of motion. Neck supple. No JVD present. No thyromegaly present.  Cardiovascular: Normal rate, regular rhythm and intact distal pulses. Exam reveals no gallop and no friction rub.  No murmur heard. Respiratory: Effort normal and breath sounds normal. No respiratory distress. He has no wheezes. He has no rales.  GI: Soft. Bowel sounds are normal. He exhibits no distension. There is no abdominal tenderness.  Musculoskeletal: Normal range of motion.        General: No edema.     Comments: No arthritis, no gout  Lymphadenopathy:    He has no cervical adenopathy.  Neurological: He is alert and oriented to person, place, and time. No cranial nerve deficit.  No dysarthria, no aphasia  Skin: Skin is warm and dry. No rash noted. No erythema.  Psychiatric: He has a normal mood and affect. His behavior is normal. Judgment and thought content normal.    LABORATORY PANEL:   CBC Recent Labs  Lab 06/28/18 2056  WBC 10.0  HGB 13.0  HCT 38.6*  PLT 204   ------------------------------------------------------------------------------------------------------------------  Chemistries  Recent Labs  Lab 06/28/18 2056  NA 141  K 2.9*  CL 103  CO2 31  GLUCOSE 117*  BUN 15  CREATININE 1.02  CALCIUM 8.1*  MG 0.7*  AST 17  ALT 20  ALKPHOS 64  BILITOT 0.6   ------------------------------------------------------------------------------------------------------------------  Cardiac Enzymes No results for input(s): TROPONINI in the last 168 hours. ------------------------------------------------------------------------------------------------------------------  RADIOLOGY:  No results found.  EKG:   Orders placed or performed during the hospital encounter of 06/28/18  . EKG 12-Lead  . EKG  12-Lead    IMPRESSION AND PLAN:  Principal Problem:   Hypomagnesemia -we will replace his magnesium throughout the night tonight.  Will require several hours of repeat IV magnesium infusions.  Recheck a.m. magnesium level Active Problems:   Benign essential HTN -continue home meds   Chronic diarrhea -patient has had some work-up done for this at [facility].  He states that no significant finding was noted to him after EGD and colonoscopy.  I encouraged the patient to follow-up with his GI doctor and/or seek a second opinion if he felt necessary.   Acid reflux -home dose PPI  Chart review performed and case discussed with ED provider. Labs, imaging and/or ECG reviewed by provider and discussed with patient/family. Management plans discussed with the patient and/or family.  DVT PROPHYLAXIS: SubQ lovenox   GI PROPHYLAXIS:  PPI   ADMISSION STATUS: Observation  CODE STATUS: Full  TOTAL TIME TAKING CARE OF THIS PATIENT: 40 minutes.   Ethlyn Daniels 06/28/2018, 11:10 PM  [facility]  Office  [phone]  CC: Primary care physician; System, Pcp Not In  Note:  This document was prepared using Dragon voice recognition software and may include unintentional dictation errors.

## 2018-06-28 NOTE — ED Triage Notes (Signed)
Pt arrives reporting his magnesium and potassium levels are low, reports that he went to his doctor for a regular check up today and they did labs. Pt says that he has been having diarrhea on and off since last year, no pain or fevers. (provider called triage and stated pt mg was 0.7 and K 2.9)

## 2018-06-29 LAB — CBC
HCT: 37.2 % — ABNORMAL LOW (ref 39.0–52.0)
Hemoglobin: 12.2 g/dL — ABNORMAL LOW (ref 13.0–17.0)
MCH: 29.3 pg (ref 26.0–34.0)
MCHC: 32.8 g/dL (ref 30.0–36.0)
MCV: 89.4 fL (ref 80.0–100.0)
Platelets: 185 10*3/uL (ref 150–400)
RBC: 4.16 MIL/uL — ABNORMAL LOW (ref 4.22–5.81)
RDW: 13.1 % (ref 11.5–15.5)
WBC: 8.4 10*3/uL (ref 4.0–10.5)
nRBC: 0 % (ref 0.0–0.2)

## 2018-06-29 LAB — POTASSIUM: Potassium: 3.2 mmol/L — ABNORMAL LOW (ref 3.5–5.1)

## 2018-06-29 LAB — MAGNESIUM
Magnesium: 1.3 mg/dL — ABNORMAL LOW (ref 1.7–2.4)
Magnesium: 2.3 mg/dL (ref 1.7–2.4)

## 2018-06-29 LAB — BASIC METABOLIC PANEL
ANION GAP: 10 (ref 5–15)
BUN: 14 mg/dL (ref 8–23)
CO2: 29 mmol/L (ref 22–32)
Calcium: 8.3 mg/dL — ABNORMAL LOW (ref 8.9–10.3)
Chloride: 102 mmol/L (ref 98–111)
Creatinine, Ser: 0.9 mg/dL (ref 0.61–1.24)
GFR calc Af Amer: >60 mL/min (ref >60)
GFR calc non Af Amer: >60 mL/min (ref >60)
Glucose, Bld: 208 mg/dL — ABNORMAL HIGH (ref 70–99)
Potassium: 2.5 mmol/L — CL (ref 3.5–5.1)
Sodium: 141 mmol/L (ref 135–145)

## 2018-06-29 MED ORDER — ENOXAPARIN SODIUM 40 MG/0.4ML ~~LOC~~ SOLN: 40.0000 mg | SUBCUTANEOUS | Status: DC

## 2018-06-29 MED ORDER — MAGNESIUM SULFATE 4 GM/100ML IV SOLN
4.0000 g | Freq: Once | INTRAVENOUS | Status: AC
  Administered 2018-06-29: 03:00:00 4 g via INTRAVENOUS
  Filled 2018-06-29: qty 100

## 2018-06-29 MED ORDER — MOMETASONE FURO-FORMOTEROL FUM 100-5 MCG/ACT IN AERO
2.0000 | INHALATION_SPRAY | Freq: Two times a day (BID) | RESPIRATORY_TRACT | Status: DC
  Filled 2018-06-29: qty 8.8

## 2018-06-29 MED ORDER — POTASSIUM CHLORIDE CRYS ER 20 MEQ PO TBCR
40.0000 meq | EXTENDED_RELEASE_TABLET | Freq: Two times a day (BID) | ORAL | Status: DC
  Administered 2018-06-29: 40 meq via ORAL
  Filled 2018-06-29: qty 2

## 2018-06-29 MED ORDER — ACETAMINOPHEN 650 MG RE SUPP: 650.0000 mg | Freq: Four times a day (QID) | RECTAL | Status: DC | PRN

## 2018-06-29 MED ORDER — ACETAMINOPHEN 325 MG PO TABS
650.0000 mg | ORAL_TABLET | Freq: Four times a day (QID) | ORAL | Status: DC | PRN
  Administered 2018-06-29: 10:00:00 650 mg via ORAL
  Filled 2018-06-29: qty 2

## 2018-06-29 MED ORDER — ATORVASTATIN CALCIUM 20 MG PO TABS
40.0000 mg | ORAL_TABLET | Freq: Every day | ORAL | Status: DC
  Administered 2018-06-29: 08:00:00 40 mg via ORAL
  Filled 2018-06-29: qty 2

## 2018-06-29 MED ORDER — POTASSIUM CHLORIDE 10 MEQ/100ML IV SOLN
10.0000 meq | INTRAVENOUS | Status: AC
  Administered 2018-06-29 (×5): 10 meq via INTRAVENOUS
  Filled 2018-06-29 (×5): qty 100

## 2018-06-29 MED ORDER — MAGNESIUM SULFATE 4 GM/100ML IV SOLN
4.0000 g | Freq: Once | INTRAVENOUS | Status: DC
  Filled 2018-06-29: qty 100

## 2018-06-29 MED ORDER — PANTOPRAZOLE SODIUM 40 MG PO TBEC
40.0000 mg | DELAYED_RELEASE_TABLET | Freq: Every day | ORAL | Status: DC
  Administered 2018-06-29: 08:00:00 40 mg via ORAL
  Filled 2018-06-29: qty 1

## 2018-06-29 MED ORDER — ONDANSETRON HCL 4 MG PO TABS: 4.0000 mg | ORAL_TABLET | Freq: Four times a day (QID) | ORAL | Status: DC | PRN

## 2018-06-29 MED ORDER — ONDANSETRON HCL 4 MG/2ML IJ SOLN: 4.0000 mg | Freq: Four times a day (QID) | INTRAMUSCULAR | Status: DC | PRN

## 2018-06-29 MED ORDER — MAGNESIUM SULFATE 2 GM/50ML IV SOLN
2.0000 g | Freq: Once | INTRAVENOUS | Status: AC
  Administered 2018-06-29: 2 g via INTRAVENOUS
  Filled 2018-06-29: qty 50

## 2018-06-29 MED ORDER — SODIUM CHLORIDE 0.9 % IV SOLN
INTRAVENOUS | Status: DC | PRN
  Administered 2018-06-29: 03:00:00 500 mL via INTRAVENOUS

## 2018-06-29 NOTE — Progress Notes (Signed)
Received Md order to discharge patient to home, reviewed home meds, follow up appointments  and dischage instructions with patient and patient verbalized understanding

## 2018-06-29 NOTE — ED Notes (Signed)
ED TO INPATIENT HANDOFF REPORT  ED Nurse Name and Phone #: Apolonio Schneiders [phone]  S Name/Age/Gender Ernestine Mcmurray 63 y.o. male Room/Bed: ED25A/ED25A  Code Status   Code Status: Not on file  Home/SNF/Other Home Patient oriented to: self, place, time and situation Is this baseline? Yes   Triage Complete: Triage complete  Chief Complaint Abnormal labs  Triage Note Pt arrives reporting his magnesium and potassium levels are low, reports that he went to his doctor for a regular check up today and they did labs. Pt says that he has been having diarrhea on and off since last year, no pain or fevers. (provider called triage and stated pt mg was 0.7 and K 2.9)   Allergies Allergies  Allergen Reactions  . Codeine Other (See Comments)    Reaction: "messes up his intestinal tract" so he doesn't like taking it.     Level of Care/Admitting Diagnosis ED Disposition    ED Disposition Condition [facility] Area: [place] [[id number]]  Level of Care: Med-Surg [16]  Diagnosis: Hypomagnesemia [258527]  Admitting Physician: Lance Coon [[id number]]  Attending Physician: Lance Coon [[id number]]  PT Class (Do Not Modify): Observation [104]  PT Acc Code (Do Not Modify): Observation [10022]       B Medical/Surgery History Past Medical History:  Diagnosis Date  . Acid reflux 12/29/2011  . Arteriosclerosis of coronary artery 12/29/2011   Overview:  Diffuse three vessel,mild  . Benign essential HTN 02/12/2014  . Breathlessness on exertion 06/09/2014  . CAFL (chronic airflow limitation) ([place]) 04/30/2013   Overview:  Spirometry 12/14 FVC 89% FEV1 79% FEV1/FVC 66% c/w moderate obstructive disease Dr. Vella Kohler   . COPD (chronic obstructive pulmonary disease) ([place])   . H/O alcohol abuse 12/29/2011  . Hernia of anterior abdominal wall 12/29/2011  . Hypertension    controlled on meds  . Kidney cysts 12/29/2011   Past Surgical History:  Procedure Laterality Date  .  CARDIAC CATHETERIZATION    . CHOLECYSTECTOMY    . COLONOSCOPY  02/08/2013  . ESOPHAGOGASTRODUODENOSCOPY  10/03/2011  . ESOPHAGOGASTRODUODENOSCOPY (EGD) WITH PROPOFOL N/A 01/09/2015   Procedure: ESOPHAGOGASTRODUODENOSCOPY (EGD) WITH PROPOFOL;  Surgeon: Lucilla Lame, MD;  Location: [facility];  Service: Endoscopy;  Laterality: N/A;  . HERNIA REPAIR    . laser benign laryngeal lesion       A IV Location/Drains/Wounds Patient Lines/Drains/Airways Status   Active Line/Drains/Airways    Name:   Placement date:   Placement time:   Site:   Days:   Peripheral IV 06/28/18 Left Antecubital   06/28/18    2150    Antecubital   1          Intake/Output Last 24 hours No intake or output data in the 24 hours ending 06/29/18 0021  Labs/Imaging Results for orders placed or performed during the hospital encounter of 06/28/18 (from the past 48 hour(s))  Lipase, blood     Status: None   Collection Time: 06/28/18  8:56 PM  Result Value Ref Range   Lipase 32 11 - 51 U/L    Comment: Performed at [facility], [address] [place], [place] [address]  Comprehensive metabolic panel     Status: Abnormal   Collection Time: 06/28/18  8:56 PM  Result Value Ref Range   Sodium 141 135 - 145 mmol/L   Potassium 2.9 (L) 3.5 - 5.1 mmol/L   Chloride 103 98 - 111 mmol/L   CO2 31 22 - 32  mmol/L   Glucose, Bld 117 (H) 70 - 99 mg/dL   BUN 15 8 - 23 mg/dL   Creatinine, Ser 1.02 0.61 - 1.24 mg/dL   Calcium 8.1 (L) 8.9 - 10.3 mg/dL   Total Protein 6.3 (L) 6.5 - 8.1 g/dL   Albumin 4.0 3.5 - 5.0 g/dL   AST 17 15 - 41 U/L   ALT 20 0 - 44 U/L   Alkaline Phosphatase 64 38 - 126 U/L   Total Bilirubin 0.6 0.3 - 1.2 mg/dL   GFR calc non Af Amer >60 >60 mL/min   GFR calc Af Amer >60 >60 mL/min   Anion gap 7 5 - 15    Comment: Performed at [facility], [address] [place], [place] [address]  CBC     Status: Abnormal   Collection Time: 06/28/18  8:56 PM  Result Value Ref Range   WBC  10.0 4.0 - 10.5 K/uL   RBC 4.35 4.22 - 5.81 MIL/uL   Hemoglobin 13.0 13.0 - 17.0 g/dL   HCT 38.6 (L) 39.0 - 52.0 %   MCV 88.7 80.0 - 100.0 fL   MCH 29.9 26.0 - 34.0 pg   MCHC 33.7 30.0 - 36.0 g/dL   RDW 13.2 11.5 - 15.5 %   Platelets 204 150 - 400 K/uL   nRBC 0.0 0.0 - 0.2 %    Comment: Performed at [facility], [address] [place], [place] [address]  Urinalysis, Complete w Microscopic     Status: Abnormal   Collection Time: 06/28/18  8:56 PM  Result Value Ref Range   Color, Urine COLORLESS (A) YELLOW   APPearance CLEAR (A) CLEAR   Specific Gravity, Urine 1.005 1.005 - 1.030   pH 7.0 5.0 - 8.0   Glucose, UA NEGATIVE NEGATIVE mg/dL   Hgb urine dipstick NEGATIVE NEGATIVE   Bilirubin Urine NEGATIVE NEGATIVE   Ketones, ur NEGATIVE NEGATIVE mg/dL   Protein, ur NEGATIVE NEGATIVE mg/dL   Nitrite NEGATIVE NEGATIVE   Leukocytes,Ua NEGATIVE NEGATIVE   RBC / HPF 0-5 0 - 5 RBC/hpf   WBC, UA NONE SEEN 0 - 5 WBC/hpf   Bacteria, UA NONE SEEN NONE SEEN   Squamous Epithelial / LPF NONE SEEN 0 - 5   Mucus PRESENT     Comment: Performed at [facility], [address] [place], [place] [address]  Magnesium     Status: Abnormal   Collection Time: 06/28/18  8:56 PM  Result Value Ref Range   Magnesium 0.7 (LL) 1.7 - 2.4 mg/dL    Comment: CRITICAL RESULT CALLED TO, READ BACK BY AND VERIFIED WITH AMY COYNE AT 2125 06/28/2018 SMA Performed at [facility], [address] [place],  [address]    No results found.  Pending Labs [employer] (From admission, onward)    Start     Ordered   Signed and Held  HIV antibody (Routine Testing)  Once,   R     Signed and Held   Signed and Held  CBC  (enoxaparin (LOVENOX)    CrCl >/= 30 ml/min)  Once,   R    Comments:  Baseline for enoxaparin therapy IF NOT ALREADY DRAWN.  Notify MD if PLT < 100 K.    Signed and Held   Signed and Held  Creatinine, serum  (enoxaparin (LOVENOX)    CrCl >/= 30 ml/min)  Once,   R     Comments:  Baseline for enoxaparin therapy IF NOT ALREADY  DRAWN.    Signed and Held   Signed and Held  Creatinine, serum  (enoxaparin (LOVENOX)    CrCl >/= 30 ml/min)  Weekly,   R    Comments:  while on enoxaparin therapy    Signed and Held   Signed and Held  Basic metabolic panel  Tomorrow morning,   R     Signed and Held   Signed and Held  CBC  Tomorrow morning,   R     Signed and Held          Vitals/Pain Today's Vitals   06/28/18 2044 06/28/18 2150 06/28/18 2239  BP: (!) 171/79 (!) 155/74   Pulse: 71 (!) 56   Resp: 16 17   Temp: 98.3 F (36.8 C)    TempSrc: Oral    SpO2: 96% 96%   Weight: 104.3 kg    Height: 5\' 8"  (1.727 m)    PainSc: 0-No pain 0-No pain 0-No pain    Isolation Precautions No active isolations  Medications Medications  sodium chloride flush (NS) 0.9 % injection 3 mL (has no administration in time range)  magnesium oxide (MAG-OX) tablet 400 mg (400 mg Oral Given 06/28/18 2237)  potassium chloride SA (K-DUR,KLOR-CON) CR tablet 40 mEq (has no administration in time range)  magnesium sulfate IVPB 2 g 50 mL (0 g Intravenous Stopped 06/28/18 2251)    Mobility walks Low fall risk   Focused Assessments Cardiac Assessment Handoff:  Cardiac Rhythm: Normal sinus rhythm, Sinus bradycardia Lab Results  Component Value Date   TROPONINI <0.03 02/11/2016   No results found for: DDIMER Does the Patient currently have chest pain? No  , Pulmonary Assessment Handoff:  Lung sounds:   O2 Device: Room Air        R Recommendations: See Admitting Provider Note  Report given to:   Additional Notes: PT SENT TO ED BY PCP FOR abnormal lab results. No complaints.

## 2018-06-29 NOTE — Consult Note (Addendum)
PHARMACY CONSULT NOTE - FOLLOW UP  Pharmacy Consult for Electrolyte Monitoring and Replacement   Recent Labs: Potassium (mmol/L)  Date Value  06/29/2018 3.2 (L)  02/20/2014 3.5   Magnesium (mg/dL)  Date Value  06/29/2018 2.3  02/20/2014 0.7 (L)   Calcium (mg/dL)  Date Value  06/29/2018 8.3 (L)   Calcium, Total (mg/dL)  Date Value  02/20/2014 8.6   Albumin (g/dL)  Date Value  06/28/2018 4.0  02/20/2014 3.8   Sodium (mmol/L)  Date Value  06/29/2018 141  02/20/2014 143   Assessment: Pharmacy consulted for electrolyte monitoring and replacement in 63 yo male with PMH of chronic diarrhea who has been admitted with electrolyte abnormalities. On admission patient's magnesium was 0.7 and potassium 2.9. Mg: 1.3>2.3, K+: 2.5>3.2 -   Goal of Therapy:  Electrolytes WNL  Plan:  Mg is WNL - no need to replace. K+ is 3.2 Pt has order of KCl 40 mEq x 1 PO and recheck potassium level in the AM.   Pharmacy will continue to monitor labs and replace electrolytes as needed.   Oswald Hillock, PharmD, BCPS Clinical Pharmacist 06/29/2018 4:16 PM

## 2018-06-29 NOTE — Discharge Summary (Signed)
[facility] [place] NAME: Dean Tanner    MR#:  [id number]  DATE OF BIRTH:  [birth year]  DATE OF ADMISSION:  06/28/2018 ADMITTING PHYSICIAN: Lance Coon, MD  DATE OF DISCHARGE: 06/29/2018  PRIMARY CARE PHYSICIAN: Hortencia Pilar, MD    ADMISSION DIAGNOSIS:  Hypokalemia [E87.6] Hypomagnesemia [E83.42]  DISCHARGE DIAGNOSIS:  Principal Problem:   Hypomagnesemia Active Problems:   Benign essential HTN   Acid reflux   Chronic diarrhea   SECONDARY DIAGNOSIS:   Past Medical History:  Diagnosis Date  . Acid reflux 12/29/2011  . Arteriosclerosis of coronary artery 12/29/2011   Overview:  Diffuse three vessel,mild  . Benign essential HTN 02/12/2014  . Breathlessness on exertion 06/09/2014  . CAFL (chronic airflow limitation) ([place]) 04/30/2013   Overview:  Spirometry 12/14 FVC 89% FEV1 79% FEV1/FVC 66% c/w moderate obstructive disease Dr. Vella Kohler   . COPD (chronic obstructive pulmonary disease) ([place])   . H/O alcohol abuse 12/29/2011  . Hernia of anterior abdominal wall 12/29/2011  . Hypertension    controlled on meds  . Kidney cysts 12/29/2011    HOSPITAL COURSE:  63 year old male with a history of essential hypertension who presented from PCP office with abnormalities in electrolytes.  1.Electrolyte abnormalities from low potassium and magnesium: These were repleted.  This is  due to chronic diarrhea.  He will have follow-up next week for repeat labs.   2.  Essential hypertension: Patient will continue chlorthalidone, lisinopril, metoprolol  3.  Depression: Continue Lexapro  4.  Hyperlipidemia: Continue statin DISCHARGE CONDITIONS AND DIET:   Stable for discharge on heart healthy diet  CONSULTS OBTAINED:    DRUG ALLERGIES:   Allergies  Allergen Reactions  . Codeine Other (See Comments)    Reaction: "messes up his intestinal tract" so he doesn't like taking it.     DISCHARGE MEDICATIONS:   Allergies as of 06/29/2018       Reactions   Codeine Other (See Comments)   Reaction: "messes up his intestinal tract" so he doesn't like taking it.       Medication List    TAKE these medications   Advair Diskus 100-50 MCG/DOSE Aepb Generic drug:  Fluticasone-Salmeterol Inhale 1 puff into the lungs daily as needed (shortness of breath).   atorvastatin 40 MG tablet Commonly known as:  LIPITOR Take 40 mg by mouth daily.   cetirizine 10 MG tablet Commonly known as:  ZYRTEC Take 10 mg by mouth at bedtime.   chlorthalidone 25 MG tablet Commonly known as:  HYGROTON Take 25 mg by mouth daily.   clobetasol cream 0.05 % Commonly known as:  TEMOVATE Apply 1 application topically as needed (dermatitis).   escitalopram 10 MG tablet Commonly known as:  LEXAPRO Take 10 mg by mouth daily.   lisinopril 20 MG tablet Commonly known as:  PRINIVIL,ZESTRIL Take 20 mg by mouth 2 (two) times daily. am   loperamide 2 MG capsule Commonly known as:  IMODIUM Take 2 mg by mouth as needed for diarrhea or loose stools.   Melatonin 10 MG Tabs Take 20 mg by mouth at bedtime.   metoprolol succinate 25 MG 24 hr tablet Commonly known as:  TOPROL-XL Take 25 mg by mouth daily.   multivitamin with minerals Tabs tablet Take 1 tablet by mouth daily.   nitroGLYCERIN 0.4 MG SL tablet Commonly known as:  NITROSTAT Place 0.4 mg under the tongue every 5 (five) minutes as needed for chest pain.   omeprazole 20 MG capsule  Commonly known as:  PRILOSEC Take 20 mg by mouth 2 (two) times daily.   potassium chloride SA 20 MEQ tablet Commonly known as:  K-DUR,KLOR-CON Take 20 mEq by mouth daily.   Sernivo 0.05 % Emul Generic drug:  Betamethasone Dipropionate Apply 1 application topically as needed (dermatitis).         Today   CHIEF COMPLAINT:  Patient without symptoms and is doing well   VITAL SIGNS:  Blood pressure 127/79, pulse (!) 57, temperature (!) 97.5 F (36.4 C), temperature source Oral, resp. rate 16, height 5'  8" (1.727 m), weight 104.3 kg, SpO2 96 %.   REVIEW OF SYSTEMS:  Review of Systems  Constitutional: Negative.  Negative for chills, fever and malaise/fatigue.  HENT: Negative.  Negative for ear discharge, ear pain, hearing loss, nosebleeds and sore throat.   Eyes: Negative.  Negative for blurred vision and pain.  Respiratory: Negative.  Negative for cough, hemoptysis, shortness of breath and wheezing.   Cardiovascular: Negative.  Negative for chest pain, palpitations and leg swelling.  Gastrointestinal: Negative.  Negative for abdominal pain, blood in stool, diarrhea, nausea and vomiting.  Genitourinary: Negative.  Negative for dysuria.  Musculoskeletal: Negative.  Negative for back pain.  Skin: Negative.   Neurological: Negative for dizziness, tremors, speech change, focal weakness, seizures and headaches.  Endo/Heme/Allergies: Negative.  Does not bruise/bleed easily.  Psychiatric/Behavioral: Negative.  Negative for depression, hallucinations and suicidal ideas.     PHYSICAL EXAMINATION:  GENERAL:  63 y.o.-year-old patient lying in the bed with no acute distress.  NECK:  Supple, no jugular venous distention. No thyroid enlargement, no tenderness.  LUNGS: Normal breath sounds bilaterally, no wheezing, rales,rhonchi  No use of accessory muscles of respiration.  CARDIOVASCULAR: S1, S2 normal. No murmurs, rubs, or gallops.  ABDOMEN: Soft, non-tender, non-distended. Bowel sounds present. No organomegaly or mass.  EXTREMITIES: No pedal edema, cyanosis, or clubbing.  PSYCHIATRIC: The patient is alert and oriented x 3.  SKIN: No obvious rash, lesion, or ulcer.   DATA REVIEW:   CBC Recent Labs  Lab 06/29/18 0333  WBC 8.4  HGB 12.2*  HCT 37.2*  PLT 185    Chemistries  Recent Labs  Lab 06/28/18 2056 06/29/18 0333  NA 141 141  K 2.9* 2.5*  CL 103 102  CO2 31 29  GLUCOSE 117* 208*  BUN 15 14  CREATININE 1.02 0.90  CALCIUM 8.1* 8.3*  MG 0.7* 1.3*  AST 17  --   ALT 20  --    ALKPHOS 64  --   BILITOT 0.6  --     Cardiac Enzymes No results for input(s): TROPONINI in the last 168 hours.  Microbiology Results  @MICRORSLT48 @  RADIOLOGY:  No results found.    Allergies as of 06/29/2018      Reactions   Codeine Other (See Comments)   Reaction: "messes up his intestinal tract" so he doesn't like taking it.       Medication List    TAKE these medications   Advair Diskus 100-50 MCG/DOSE Aepb Generic drug:  Fluticasone-Salmeterol Inhale 1 puff into the lungs daily as needed (shortness of breath).   atorvastatin 40 MG tablet Commonly known as:  LIPITOR Take 40 mg by mouth daily.   cetirizine 10 MG tablet Commonly known as:  ZYRTEC Take 10 mg by mouth at bedtime.   chlorthalidone 25 MG tablet Commonly known as:  HYGROTON Take 25 mg by mouth daily.   clobetasol cream 0.05 % Commonly known as:  TEMOVATE Apply 1 application topically as needed (dermatitis).   escitalopram 10 MG tablet Commonly known as:  LEXAPRO Take 10 mg by mouth daily.   lisinopril 20 MG tablet Commonly known as:  PRINIVIL,ZESTRIL Take 20 mg by mouth 2 (two) times daily. am   loperamide 2 MG capsule Commonly known as:  IMODIUM Take 2 mg by mouth as needed for diarrhea or loose stools.   Melatonin 10 MG Tabs Take 20 mg by mouth at bedtime.   metoprolol succinate 25 MG 24 hr tablet Commonly known as:  TOPROL-XL Take 25 mg by mouth daily.   multivitamin with minerals Tabs tablet Take 1 tablet by mouth daily.   nitroGLYCERIN 0.4 MG SL tablet Commonly known as:  NITROSTAT Place 0.4 mg under the tongue every 5 (five) minutes as needed for chest pain.   omeprazole 20 MG capsule Commonly known as:  PRILOSEC Take 20 mg by mouth 2 (two) times daily.   potassium chloride SA 20 MEQ tablet Commonly known as:  K-DUR,KLOR-CON Take 20 mEq by mouth daily.   Sernivo 0.05 % Emul Generic drug:  Betamethasone Dipropionate Apply 1 application topically as needed  (dermatitis).           Management plans discussed with the patient and he is in agreement. Stable for discharge home  Patient should follow up with pcp  CODE STATUS:     Code Status Orders  (From admission, onward)         Start     Ordered   06/29/18 0111  Full code  Continuous     06/29/18 0110        Code Status History    This patient has a current code status but no historical code status.      TOTAL TIME TAKING CARE OF THIS PATIENT: 38 minutes.    Note: This dictation was prepared with Dragon dictation along with smaller phrase technology. Any transcriptional errors that result from this process are unintentional.  Gemini Beaumier M.D on 06/29/2018 at 9:49 AM  Between 7am to 6pm - Pager - [phone] After 6pm go to [email or web address] - password [email or web address] [facility]  Office  [phone]  CC: Primary care physician; Hortencia Pilar, MD

## 2018-06-29 NOTE — Progress Notes (Signed)
Patient transferred from the ED to room 103 via hospital stretcher. Oriented to room and room equipment. Explained Fall risk protocol to patient. Currently complains of no discomfort or dyspnea. Will continue to monitor to end of shift.

## 2018-06-29 NOTE — Consult Note (Signed)
PHARMACY CONSULT NOTE - FOLLOW UP  Pharmacy Consult for Electrolyte Monitoring and Replacement   Recent Labs: Potassium (mmol/L)  Date Value  06/29/2018 2.5 (LL)  02/20/2014 3.5   Magnesium (mg/dL)  Date Value  06/29/2018 1.3 (L)  02/20/2014 0.7 (L)   Calcium (mg/dL)  Date Value  06/29/2018 8.3 (L)   Calcium, Total (mg/dL)  Date Value  02/20/2014 8.6   Albumin (g/dL)  Date Value  06/28/2018 4.0  02/20/2014 3.8   Sodium (mmol/L)  Date Value  06/29/2018 141  02/20/2014 143   Assessment: Pharmacy consulted for electrolyte monitoring and replacement in 63 yo male with PMH of chronic diarrhea who has been admitted with electrolyte abnormalities. On admission patient's magnesium was 0.7 and potassium 2.9.   Goal of Therapy:  Electrolytes WNL  Plan:  3/6 Mg: 1.3, K+: 2.5- Patient had Mag 4 IV infusion that ended after labs resulted. KCL 44mEq x 6 IV and KCL 8mEq PO twice daily ordered by physician. Will order Magnesium 2g IV x 1 dose now No additional replacement ordered at this time.   Will Recheck Mg and K+ @ 1800 tonight.   Pharmacy will continue to monitor labs and replace electrolytes as needed.   Pernell Dupre, PharmD, BCPS Clinical Pharmacist 06/29/2018 8:06 AM

## 2018-06-29 NOTE — Progress Notes (Signed)
CRITICAL VALUE ALERT  Critical Value:  Potassium 2.5  Date & Time Notied: 06/29/2018 0430  Provider Notified:Dr. Marcille Blanco  Orders Received/Actions taken:Continue to monitor and Potassium to be given as ordered.

## 2018-06-29 NOTE — Plan of Care (Signed)

## 2018-06-30 LAB — HIV ANTIBODY (ROUTINE TESTING W REFLEX): HIV Screen 4th Generation wRfx: NONREACTIVE

## 2018-08-23 ENCOUNTER — Observation Stay
Admission: EM | Admit: 2018-08-23 | Discharge: 2018-08-24 | Disposition: A | Discharge status: Home / Self Care | Payer: Medicare Other | Attending: Internal Medicine | Admitting: Internal Medicine

## 2018-08-23 ENCOUNTER — Other Ambulatory Visit: Payer: Self-pay

## 2018-08-23 DIAGNOSIS — E876 Hypokalemia: Secondary | ICD-10-CM | POA: Insufficient documentation

## 2018-08-23 DIAGNOSIS — Z885 Allergy status to narcotic agent status: Secondary | ICD-10-CM | POA: Diagnosis not present

## 2018-08-23 DIAGNOSIS — E669 Obesity, unspecified: Secondary | ICD-10-CM | POA: Insufficient documentation

## 2018-08-23 DIAGNOSIS — Z79899 Other long term (current) drug therapy: Secondary | ICD-10-CM | POA: Diagnosis not present

## 2018-08-23 DIAGNOSIS — E785 Hyperlipidemia, unspecified: Secondary | ICD-10-CM | POA: Diagnosis not present

## 2018-08-23 DIAGNOSIS — I493 Ventricular premature depolarization: Secondary | ICD-10-CM | POA: Diagnosis not present

## 2018-08-23 DIAGNOSIS — Z8249 Family history of ischemic heart disease and other diseases of the circulatory system: Secondary | ICD-10-CM | POA: Insufficient documentation

## 2018-08-23 DIAGNOSIS — Z7951 Long term (current) use of inhaled steroids: Secondary | ICD-10-CM | POA: Diagnosis not present

## 2018-08-23 DIAGNOSIS — Z6834 Body mass index (BMI) 34.0-34.9, adult: Secondary | ICD-10-CM | POA: Insufficient documentation

## 2018-08-23 DIAGNOSIS — K219 Gastro-esophageal reflux disease without esophagitis: Secondary | ICD-10-CM | POA: Diagnosis not present

## 2018-08-23 DIAGNOSIS — I10 Essential (primary) hypertension: Secondary | ICD-10-CM | POA: Diagnosis not present

## 2018-08-23 DIAGNOSIS — K529 Noninfective gastroenteritis and colitis, unspecified: Secondary | ICD-10-CM | POA: Insufficient documentation

## 2018-08-23 DIAGNOSIS — Z87891 Personal history of nicotine dependence: Secondary | ICD-10-CM | POA: Diagnosis not present

## 2018-08-23 DIAGNOSIS — J449 Chronic obstructive pulmonary disease, unspecified: Secondary | ICD-10-CM | POA: Diagnosis not present

## 2018-08-23 DIAGNOSIS — I2510 Atherosclerotic heart disease of native coronary artery without angina pectoris: Secondary | ICD-10-CM | POA: Insufficient documentation

## 2018-08-23 DIAGNOSIS — E878 Other disorders of electrolyte and fluid balance, not elsewhere classified: Principal | ICD-10-CM | POA: Insufficient documentation

## 2018-08-23 LAB — CBC WITH DIFFERENTIAL/PLATELET
Abs Immature Granulocytes: 0.06 10*3/uL (ref 0.00–0.07)
Basophils Absolute: 0.1 10*3/uL (ref 0.0–0.1)
Basophils Relative: 1 %
Eosinophils Absolute: 0.3 10*3/uL (ref 0.0–0.5)
Eosinophils Relative: 3 %
HCT: 37.9 % — ABNORMAL LOW (ref 39.0–52.0)
Hemoglobin: 12.9 g/dL — ABNORMAL LOW (ref 13.0–17.0)
Immature Granulocytes: 1 %
Lymphocytes Relative: 14 %
Lymphs Abs: 1.3 10*3/uL (ref 0.7–4.0)
MCH: 30.9 pg (ref 26.0–34.0)
MCHC: 34 g/dL (ref 30.0–36.0)
MCV: 90.7 fL (ref 80.0–100.0)
Monocytes Absolute: 0.5 10*3/uL (ref 0.1–1.0)
Monocytes Relative: 6 %
Neutro Abs: 7.1 10*3/uL (ref 1.7–7.7)
Neutrophils Relative %: 75 %
Platelets: 180 10*3/uL (ref 150–400)
RBC: 4.18 MIL/uL — ABNORMAL LOW (ref 4.22–5.81)
RDW: 14.6 % (ref 11.5–15.5)
WBC: 9.3 10*3/uL (ref 4.0–10.5)
nRBC: 0 % (ref 0.0–0.2)

## 2018-08-23 LAB — COMPREHENSIVE METABOLIC PANEL
ALT: 16 U/L (ref 0–44)
AST: 23 U/L (ref 15–41)
Albumin: 3.7 g/dL (ref 3.5–5.0)
Alkaline Phosphatase: 76 U/L (ref 38–126)
Anion gap: 11 (ref 5–15)
BUN: 18 mg/dL (ref 8–23)
CO2: 27 mmol/L (ref 22–32)
Calcium: 7.9 mg/dL — ABNORMAL LOW (ref 8.9–10.3)
Chloride: 103 mmol/L (ref 98–111)
Creatinine, Ser: 1.07 mg/dL (ref 0.61–1.24)
GFR calc Af Amer: >60 mL/min (ref >60)
GFR calc non Af Amer: >60 mL/min (ref >60)
Glucose, Bld: 128 mg/dL — ABNORMAL HIGH (ref 70–99)
Potassium: 2.7 mmol/L — CL (ref 3.5–5.1)
Sodium: 141 mmol/L (ref 135–145)
Total Bilirubin: 0.9 mg/dL (ref 0.3–1.2)
Total Protein: 6.8 g/dL (ref 6.5–8.1)

## 2018-08-23 LAB — MAGNESIUM: Magnesium: 0.8 mg/dL — CL (ref 1.7–2.4)

## 2018-08-23 MED ORDER — POTASSIUM CHLORIDE CRYS ER 20 MEQ PO TBCR
40.0000 meq | EXTENDED_RELEASE_TABLET | Freq: Once | ORAL | Status: AC
  Administered 2018-08-23: 40 meq via ORAL
  Filled 2018-08-23: qty 2

## 2018-08-23 MED ORDER — SODIUM CHLORIDE 0.9 % IV SOLN: 1.0000 g | Freq: Once | INTRAVENOUS | Status: DC

## 2018-08-23 MED ORDER — POTASSIUM CHLORIDE 10 MEQ/100ML IV SOLN
10.0000 meq | INTRAVENOUS | Status: AC
  Administered 2018-08-23: 10 meq via INTRAVENOUS
  Filled 2018-08-23 (×2): qty 100

## 2018-08-23 MED ORDER — MAGNESIUM SULFATE 2 GM/50ML IV SOLN
2.0000 g | Freq: Once | INTRAVENOUS | Status: AC
  Administered 2018-08-23: 2 g via INTRAVENOUS
  Filled 2018-08-23: qty 50

## 2018-08-23 MED ORDER — CALCIUM GLUCONATE-NACL 1-0.675 GM/50ML-% IV SOLN
1.0000 g | Freq: Once | INTRAVENOUS | Status: AC
  Administered 2018-08-23: 1000 mg via INTRAVENOUS
  Filled 2018-08-23: qty 50

## 2018-08-23 MED ORDER — MAGNESIUM SULFATE 4 GM/100ML IV SOLN
4.0000 g | Freq: Once | INTRAVENOUS | Status: AC
  Administered 2018-08-23: 4 g via INTRAVENOUS
  Filled 2018-08-23: qty 100

## 2018-08-23 NOTE — H&P (Signed)
[facility] NAME: Dean Tanner    MR#:  [id number]  DATE OF BIRTH:  1955/05/27  DATE OF ADMISSION:  08/23/2018  PRIMARY CARE PHYSICIAN: Hortencia Pilar, MD   REQUESTING/REFERRING PHYSICIAN: Alfred Levins, MD  CHIEF COMPLAINT:   Chief Complaint  Patient presents with  . Abnormal Lab    HISTORY OF PRESENT ILLNESS:  Dean Tanner  is a 63 y.o. male who presents with chief complaint as above.  Patient presents to the ED due to electrolyte abnormalities.  He has chronic diarrhea of unclear etiology, and has had this for several years.  Every so often his electrolytes are depleted to the point that he has to come to the hospital for repletion.  Patient was significantly fatigued and lethargic today, and felt some intermittent palpitations.  Here in the ED his magnesium potassium and calcium are low, and he was having significant PVCs on cardiac monitoring.  Hospitalist were called for admission  PAST MEDICAL HISTORY:   Past Medical History:  Diagnosis Date  . Acid reflux 12/29/2011  . Arteriosclerosis of coronary artery 12/29/2011   Overview:  Diffuse three vessel,mild  . Benign essential HTN 02/12/2014  . Breathlessness on exertion 06/09/2014  . CAFL (chronic airflow limitation) ([place]) 04/30/2013   Overview:  Spirometry 12/14 FVC 89% FEV1 79% FEV1/FVC 66% c/w moderate obstructive disease Dr. Vella Kohler   . COPD (chronic obstructive pulmonary disease) ([place])   . H/O alcohol abuse 12/29/2011  . Hernia of anterior abdominal wall 12/29/2011  . Hypertension    controlled on meds  . Kidney cysts 12/29/2011     PAST SURGICAL HISTORY:   Past Surgical History:  Procedure Laterality Date  . CARDIAC CATHETERIZATION    . CHOLECYSTECTOMY    . COLONOSCOPY  02/08/2013  . ESOPHAGOGASTRODUODENOSCOPY  10/03/2011  . ESOPHAGOGASTRODUODENOSCOPY (EGD) WITH PROPOFOL N/A 01/09/2015   Procedure: ESOPHAGOGASTRODUODENOSCOPY (EGD) WITH PROPOFOL;  Surgeon:  Lucilla Lame, MD;  Location: [facility];  Service: Endoscopy;  Laterality: N/A;  . HERNIA REPAIR    . laser benign laryngeal lesion       SOCIAL HISTORY:   Social History   Tobacco Use  . Smoking status: Former Smoker    Packs/day: 1.00    Years: 35.00    Pack years: 35.00  . Smokeless tobacco: Former Systems developer    Types: Snuff  Substance Use Topics  . Alcohol use: Yes    Alcohol/week: 0.0 standard drinks     FAMILY HISTORY:   Family History  Problem Relation Age of Onset  . Coronary artery disease Father   . Colon polyps Father   . Gallbladder disease Father   . Coronary artery disease Mother   . Hypertension Mother   . Heart failure Mother   . Gallbladder disease Brother   . Colon polyps Brother      DRUG ALLERGIES:   Allergies  Allergen Reactions  . Codeine Other (See Comments)    Reaction: "messes up his intestinal tract" so he doesn't like taking it.     MEDICATIONS AT HOME:   Prior to Admission medications   Medication Sig Start Date End Date Taking? Authorizing Provider  acetaminophen (TYLENOL) 650 MG CR tablet Take 650 mg by mouth. One tablet every 8 to 12 hours as needed for joint pain. 05/10/18  Yes [provider]  ADVAIR DISKUS 100-50 MCG/DOSE AEPB Inhale 1 puff into the lungs daily as needed (shortness of breath).    Yes [provider]  albuterol (VENTOLIN HFA) 108 (90 Base) MCG/ACT inhaler Inhale 2 puffs into the lungs every 4 (four) hours as needed. 07/16/18 07/16/19 Yes [provider]  atorvastatin (LIPITOR) 40 MG tablet Take 40 mg by mouth daily.  10/08/14  Yes [provider]  Betamethasone Dipropionate (SERNIVO) 0.05 % EMUL Apply 1 application topically as needed (dermatitis).   Yes [provider]  cetirizine (ZYRTEC) 10 MG tablet Take 10 mg by mouth at bedtime.    Yes [provider]  chlorthalidone (HYGROTON) 25 MG tablet Take 25 mg by mouth daily.   Yes [provider]   clobetasol cream (TEMOVATE) 5.39 % Apply 1 application topically as needed (dermatitis).   Yes [provider]  Clocortolone Pivalate (CLODERM) 0.1 % cream Apply 1 application topically as needed. 10/10/17  Yes [provider]  Crisaborole (EUCRISA) 2 % OINT Apply 1 application topically 2 (two) times daily as needed. 09/28/17  Yes [provider]  escitalopram (LEXAPRO) 10 MG tablet Take 10 mg by mouth daily.   Yes [provider]  fluticasone (FLONASE) 50 MCG/ACT nasal spray Place 2 sprays into the nose daily. 07/05/18 07/05/19 Yes [provider]  lisinopril (PRINIVIL,ZESTRIL) 20 MG tablet Take 20 mg by mouth 2 (two) times daily. am 12/18/14  Yes [provider]  loperamide (IMODIUM) 2 MG capsule Take 2 mg by mouth as needed for diarrhea or loose stools.   Yes [provider]  magnesium oxide (MAG-OX) 400 MG tablet Take 800 mg by mouth daily.   Yes [provider]  Melatonin 10 MG TABS Take 20 mg by mouth at bedtime.   Yes [provider]  metoprolol succinate (TOPROL-XL) 25 MG 24 hr tablet Take 25 mg by mouth daily.   Yes [provider]  Multiple Vitamin (MULTIVITAMIN WITH MINERALS) TABS tablet Take 1 tablet by mouth daily.    Yes [provider]  mupirocin ointment (BACTROBAN) 2 % Apply 1 application topically 2 (two) times daily as needed. 03/25/18  Yes [provider]  nitroGLYCERIN (NITROSTAT) 0.4 MG SL tablet Place 0.4 mg under the tongue every 5 (five) minutes as needed for chest pain.  06/12/14  Yes [provider]  omeprazole (PRILOSEC) 20 MG capsule Take 20 mg by mouth 2 (two) times daily.   Yes [provider]  pimecrolimus (ELIDEL) 1 % cream Apply 1 application topically 2 (two) times daily as needed. 03/25/18  Yes [provider]  potassium chloride SA (K-DUR,KLOR-CON) 20 MEQ tablet Take 20 mEq by mouth daily.   Yes [provider]    REVIEW OF  SYSTEMS:  Review of Systems  Constitutional: Positive for malaise/fatigue. Negative for chills, fever and weight loss.  HENT: Negative for ear pain, hearing loss and tinnitus.   Eyes: Negative for blurred vision, double vision, pain and redness.  Respiratory: Negative for cough, hemoptysis and shortness of breath.   Cardiovascular: Positive for palpitations. Negative for chest pain, orthopnea and leg swelling.  Gastrointestinal: Negative for abdominal pain, constipation, diarrhea, nausea and vomiting.  Genitourinary: Negative for dysuria, frequency and hematuria.  Musculoskeletal: Negative for back pain, joint pain and neck pain.  Skin:       No acne, rash, or lesions  Neurological: Negative for dizziness, tremors, focal weakness and weakness.  Endo/Heme/Allergies: Negative for polydipsia. Does not bruise/bleed easily.  Psychiatric/Behavioral: Negative for depression. The patient is not nervous/anxious and does not have insomnia.      VITAL SIGNS:   Vitals:   08/23/18  2058 08/23/18 2109 08/23/18 2130 08/23/18 2230  BP:  (!) 147/90 (!) 145/78 130/71  Pulse:  79 81 78  Resp:  14 15 17   Temp:      TempSrc:      SpO2:  98% 98% 96%  Weight: 104.3 kg     Height: 5\' 8"  (1.727 m)      Wt Readings from Last 3 Encounters:  08/23/18 104.3 kg  06/28/18 104.3 kg  02/11/16 104.3 kg    PHYSICAL EXAMINATION:  Physical Exam  Vitals reviewed. Constitutional: He is oriented to person, place, and time. He appears well-developed and well-nourished. No distress.  HENT:  Head: Normocephalic and atraumatic.  Mouth/Throat: Oropharynx is clear and moist.  Eyes: Pupils are equal, round, and reactive to light. Conjunctivae and EOM are normal. No scleral icterus.  Neck: Normal range of motion. Neck supple. No JVD present. No thyromegaly present.  Cardiovascular: Normal rate and intact distal pulses. Exam reveals no gallop and no friction rub.  No murmur heard. Sinus rhythm with intermittent PVCs   Respiratory: Effort normal and breath sounds normal. No respiratory distress. He has no wheezes. He has no rales.  GI: Soft. Bowel sounds are normal. He exhibits no distension. There is no abdominal tenderness.  Musculoskeletal: Normal range of motion.        General: No edema.     Comments: No arthritis, no gout  Lymphadenopathy:    He has no cervical adenopathy.  Neurological: He is alert and oriented to person, place, and time. No cranial nerve deficit.  No dysarthria, no aphasia  Skin: Skin is warm and dry. No rash noted. No erythema.  Psychiatric: He has a normal mood and affect. His behavior is normal. Judgment and thought content normal.    LABORATORY PANEL:   CBC Recent Labs  Lab 08/23/18 2112  WBC 9.3  HGB 12.9*  HCT 37.9*  PLT 180   ------------------------------------------------------------------------------------------------------------------  Chemistries  Recent Labs  Lab 08/23/18 2112  NA 141  K 2.7*  CL 103  CO2 27  GLUCOSE 128*  BUN 18  CREATININE 1.07  CALCIUM 7.9*  MG 0.8*  AST 23  ALT 16  ALKPHOS 76  BILITOT 0.9   ------------------------------------------------------------------------------------------------------------------  Cardiac Enzymes No results for input(s): TROPONINI in the last 168 hours. ------------------------------------------------------------------------------------------------------------------  RADIOLOGY:  No results found.  EKG:   Orders placed or performed during the hospital encounter of 08/23/18  . EKG 12-Lead  . EKG 12-Lead    IMPRESSION AND PLAN:  Principal Problem:   Electrolyte depletion -replace magnesium potassium and calcium.  Recheck levels in the morning Active Problems:   Arteriosclerosis of coronary artery -continue home meds   Benign essential HTN -home dose antihypertensives   Chronic diarrhea -home dose Imodium   Acid reflux -home dose PPI  Chart review performed and case discussed with ED  provider. Labs, imaging and/or ECG reviewed by provider and discussed with patient/family. Management plans discussed with the patient and/or family.  DVT PROPHYLAXIS: SubQ lovenox   GI PROPHYLAXIS:  PPI   ADMISSION STATUS: Observation  CODE STATUS: Full Code Status History    Date Active Date Inactive Code Status Order ID Comments User Context   06/29/2018 0110 06/29/2018 2104 Full Code [id number]  Lance Coon, MD Inpatient      TOTAL TIME TAKING CARE OF THIS PATIENT: 40 minutes.   Ethlyn Daniels 08/23/2018, 10:49 PM  [facility]  Office  [phone]  CC: Primary care physician; Hortencia Pilar, MD  Note:  This document was prepared using Dragon voice recognition software and may include unintentional dictation errors.

## 2018-08-23 NOTE — ED Notes (Signed)
Date and time results received: 08/23/18  Test: potassium; Magnesium  Critical Value: 2.7 ; 0.8  Name of Provider Notified: Alfred Levins   Orders Received? Or Actions Taken?: MD notified

## 2018-08-23 NOTE — ED Provider Notes (Signed)
[facility] Emergency Department Provider Note  ____________________________________________  Time seen: Approximately 9:46 PM  I have reviewed the triage vital signs and the nursing notes.   HISTORY  Chief Complaint Abnormal Lab   HPI Dean Tanner is a 63 y.o. male with a history of chronic diarrhea who presents at the request of his primary care doctor for hypomagnesemia and hypokalemia.  Patient has had chronic diarrhea for over a year and a half.  Underwent extensive evaluation by GI at [facility] with no known etiology.  Has been hospitalized several times in the past with low electrolytes because of his diarrhea.  Continues to have several daily episodes of nonbloody diarrhea.  He reports that he has a bowel movement every time he eats anything.  Has been taking p.o. potassium and magnesium as prescribed.  Today he went to see his primary care doctor because he was feeling very weak and fatigued which is usually the symptoms that he has when his electrolytes are low.  Outpatient labs showed K of 2.6 and mag of 0.6 and patient was sent to the emergency room for further management.  No dizziness or chest pain.  No nausea or vomiting.  No abdominal pain.  No fever or chills.  Past Medical History:  Diagnosis Date   Acid reflux 12/29/2011   Arteriosclerosis of coronary artery 12/29/2011   Overview:  Diffuse three vessel,mild   Benign essential HTN 02/12/2014   Breathlessness on exertion 06/09/2014   CAFL (chronic airflow limitation) ([place]) 04/30/2013   Overview:  Spirometry 12/14 FVC 89% FEV1 79% FEV1/FVC 66% c/w moderate obstructive disease Dr. 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   Benign essential HTN 02/12/2014   Ankle edema 01/09/2014   CAFL (chronic airflow limitation) ([place]) 04/30/2013   Acute bronchospasm 04/02/2013   Arteriosclerosis of coronary artery 12/29/2011   Acid reflux 12/29/2011   H/O alcohol abuse 12/29/2011   Kidney cysts 12/29/2011   Diagnostic skin and sensitization tests 12/29/2011   Prenatal consult 12/29/2011   Hernia of anterior abdominal wall 12/29/2011    Past Surgical History:  Procedure Laterality Date   CARDIAC CATHETERIZATION     CHOLECYSTECTOMY     COLONOSCOPY  02/08/2013   ESOPHAGOGASTRODUODENOSCOPY  10/03/2011   ESOPHAGOGASTRODUODENOSCOPY (EGD) WITH PROPOFOL N/A 01/09/2015   Procedure: ESOPHAGOGASTRODUODENOSCOPY (EGD) WITH PROPOFOL;  Surgeon: Lucilla Lame, MD;  Location: [facility];  Service: Endoscopy;  Laterality: N/A;   HERNIA REPAIR     laser benign laryngeal lesion      Prior to Admission medications   Medication Sig Start Date End Date Taking? Authorizing Provider  ADVAIR DISKUS 100-50 MCG/DOSE AEPB Inhale 1 puff into the lungs daily as needed (shortness of breath).     [provider]  atorvastatin (LIPITOR) 40 MG tablet Take 40 mg by mouth daily.  10/08/14   [provider]  Betamethasone Dipropionate (SERNIVO) 0.05 %  EMUL Apply 1 application topically as needed (dermatitis).    [provider]  cetirizine (ZYRTEC) 10 MG tablet Take 10 mg by mouth at bedtime.     [provider]  chlorthalidone (HYGROTON) 25 MG tablet Take 25 mg by mouth daily.    [provider]  clobetasol cream (TEMOVATE) 9.70 % Apply 1 application topically as needed  (dermatitis).    [provider]  escitalopram (LEXAPRO) 10 MG tablet Take 10 mg by mouth daily.    [provider]  lisinopril (PRINIVIL,ZESTRIL) 20 MG tablet Take 20 mg by mouth 2 (two) times daily. am 12/18/14   [provider]  loperamide (IMODIUM) 2 MG capsule Take 2 mg by mouth as needed for diarrhea or loose stools.    [provider]  Melatonin 10 MG TABS Take 20 mg by mouth at bedtime.    [provider]  metoprolol succinate (TOPROL-XL) 25 MG 24 hr tablet Take 25 mg by mouth daily.    [provider]  Multiple Vitamin (MULTIVITAMIN WITH MINERALS) TABS tablet Take 1 tablet by mouth daily.     [provider]  nitroGLYCERIN (NITROSTAT) 0.4 MG SL tablet Place 0.4 mg under the tongue every 5 (five) minutes as needed for chest pain.  06/12/14   [provider]  omeprazole (PRILOSEC) 20 MG capsule Take 20 mg by mouth 2 (two) times daily.    [provider]  potassium chloride SA (K-DUR,KLOR-CON) 20 MEQ tablet Take 20 mEq by mouth daily.    [provider]    Allergies Codeine  Family History  Problem Relation Age of Onset   Coronary artery disease Father    Colon polyps Father    Gallbladder disease Father    Coronary artery disease Mother    Hypertension Mother    Heart failure Mother    Gallbladder disease Brother    Colon polyps Brother     Social History Social History   Tobacco Use   Smoking status: Former Smoker    Packs/day: 1.00    Years: 35.00    Pack years: 35.00   Smokeless tobacco: Former Systems developer    Types: Snuff  Substance Use Topics   Alcohol use: Yes    Alcohol/week: 0.0 standard drinks   Drug use: No    Review of Systems  Constitutional: Negative for fever. + generalized weakness Eyes: Negative for visual changes. ENT: Negative for sore throat. Neck: No neck pain  Cardiovascular: Negative for chest pain. Respiratory: Negative for shortness of  breath. Gastrointestinal: Negative for abdominal pain, vomiting. + diarrhea. Genitourinary: Negative for dysuria. Musculoskeletal: Negative for back pain. Skin: Negative for rash. Neurological: Negative for headaches, weakness or numbness. Psych: No SI or HI  ____________________________________________   PHYSICAL EXAM:  VITAL SIGNS: ED Triage Vitals  Enc Vitals Group     BP 08/23/18 2057 (!) 165/91     Pulse Rate 08/23/18 2057 87     Resp 08/23/18 2057 20     Temp 08/23/18 2057 99.5 F (37.5 C)     Temp Source 08/23/18 2057 Oral     SpO2 08/23/18 2057 99 %     Weight 08/23/18 2058 230 lb (104.3 kg)     Height 08/23/18 2058 5\' 8"  (1.727 m)     Head Circumference --      Peak Flow --      Pain Score 08/23/18 2057 0     Pain Loc --      Pain Edu? --  Excl. in [place]? --     Constitutional: Alert and oriented. Well appearing and in no apparent distress. HEENT:      Head: Normocephalic and atraumatic.         Eyes: Conjunctivae are normal. Sclera is non-icteric.       Mouth/Throat: Mucous membranes are moist.       Neck: Supple with no signs of meningismus. Cardiovascular: Regular rate and rhythm. No murmurs, gallops, or rubs. 2+ symmetrical distal pulses are present in all extremities. No JVD. Respiratory: Normal respiratory effort. Lungs are clear to auscultation bilaterally. No wheezes, crackles, or rhonchi.  Gastrointestinal: Soft, non tender, and non distended with positive bowel sounds. No rebound or guarding. Musculoskeletal: Nontender with normal range of motion in all extremities. No edema, cyanosis, or erythema of extremities. Neurologic: Normal speech and language. Face is symmetric. Moving all extremities. No gross focal neurologic deficits are appreciated. Skin: Skin is warm, dry and intact. No rash noted. Psychiatric: Mood and affect are normal. Speech and behavior are normal.  ____________________________________________   LABS (all labs ordered are  listed, but only abnormal results are displayed)  Labs Reviewed  CBC WITH DIFFERENTIAL/PLATELET - Abnormal; Notable for the following components:      Result Value   RBC 4.18 (*)    Hemoglobin 12.9 (*)    HCT 37.9 (*)    All other components within normal limits  COMPREHENSIVE METABOLIC PANEL - Abnormal; Notable for the following components:   Potassium 2.7 (*)    Glucose, Bld 128 (*)    Calcium 7.9 (*)    All other components within normal limits  MAGNESIUM - Abnormal; Notable for the following components:   Magnesium 0.8 (*)    All other components within normal limits   ____________________________________________  EKG  ED ECG REPORT I, Rudene Re, the attending physician, personally viewed and interpreted this ECG.  Normal sinus rhythm with frequent PVCs, normal intervals, normal axis, no ST elevations or depressions.  Frequent PVCs are new when compared to prior.   ____________________________________________  RADIOLOGY  none  ____________________________________________   PROCEDURES  Procedure(s) performed: None Procedures Critical Care performed: yes  CRITICAL CARE Performed by: Rudene Re  ?  Total critical care time: 35 min  Critical care time was exclusive of separately billable procedures and treating other patients.  Critical care was necessary to treat or prevent imminent or life-threatening deterioration.  Critical care was time spent personally by me on the following activities: development of treatment plan with patient and/or surrogate as well as nursing, discussions with consultants, evaluation of patient's response to treatment, examination of patient, obtaining history from patient or surrogate, ordering and performing treatments and interventions, ordering and review of laboratory studies, ordering and review of radiographic studies, pulse oximetry and re-evaluation of patient's  condition.  ____________________________________________   INITIAL IMPRESSION / ASSESSMENT AND PLAN / ED COURSE  63 y.o. male with a history of chronic diarrhea who presents at the request of his primary care doctor for hypomagnesemia and hypokalemia.  Patient with an extensive evaluation done at [facility] by GI for his chronic diarrhea with no known etiology.  Has had several prior admissions for significant electrolyte abnormalities due to his diarrhea.  Presented to PCP today for generalized weakness and found to be hypokalemic and hypomagnesemic.  EKG with no arrhythmias however patient does have frequent PVCs.  Repeat labs here showing potassium of 2.7 and magnesium of 0.8.  IV and p.o. potassium and IV magnesium have been ordered for  supplementation.  Will monitor carefully on telemetry. Will admit to Hospitalist.      As part of my medical decision making, I reviewed the following data within the [place] notes reviewed and incorporated, Labs reviewed , EKG interpreted , Old EKG reviewed, Old chart reviewed, Discussed with admitting physician , Notes from prior ED visits and [place] Controlled Substance Database    Pertinent labs & imaging results that were available during my care of the patient were reviewed by me and considered in my medical decision making (see chart for details).    ____________________________________________   FINAL CLINICAL IMPRESSION(S) / ED DIAGNOSES  Final diagnoses:  Hypokalemia  Hypomagnesemia  Chronic diarrhea  Hypocalcemia      NEW MEDICATIONS STARTED DURING THIS VISIT:  ED Discharge Orders    None       Note:  This document was prepared using Dragon voice recognition software and may include unintentional dictation errors.    Rudene Re, MD 08/23/18 2155

## 2018-08-23 NOTE — ED Triage Notes (Addendum)
Pt was called by pmb for low potassium and mag that he had drawn today. Went to pmd for lethargy, palpitation and weakness. Hx of the same due to chronic diarrhea.

## 2018-08-24 DIAGNOSIS — E878 Other disorders of electrolyte and fluid balance, not elsewhere classified: Secondary | ICD-10-CM | POA: Diagnosis not present

## 2018-08-24 LAB — CBC
HCT: 36.8 % — ABNORMAL LOW (ref 39.0–52.0)
Hemoglobin: 12.6 g/dL — ABNORMAL LOW (ref 13.0–17.0)
MCH: 31 pg (ref 26.0–34.0)
MCHC: 34.2 g/dL (ref 30.0–36.0)
MCV: 90.4 fL (ref 80.0–100.0)
Platelets: 194 10*3/uL (ref 150–400)
RBC: 4.07 MIL/uL — ABNORMAL LOW (ref 4.22–5.81)
RDW: 14.7 % (ref 11.5–15.5)
WBC: 9.6 10*3/uL (ref 4.0–10.5)
nRBC: 0 % (ref 0.0–0.2)

## 2018-08-24 LAB — BASIC METABOLIC PANEL
Anion gap: 10 (ref 5–15)
BUN: 19 mg/dL (ref 8–23)
CO2: 26 mmol/L (ref 22–32)
Calcium: 7.9 mg/dL — ABNORMAL LOW (ref 8.9–10.3)
Chloride: 102 mmol/L (ref 98–111)
Creatinine, Ser: 0.94 mg/dL (ref 0.61–1.24)
GFR calc Af Amer: >60 mL/min (ref >60)
GFR calc non Af Amer: >60 mL/min (ref >60)
Glucose, Bld: 110 mg/dL — ABNORMAL HIGH (ref 70–99)
Potassium: 3.1 mmol/L — ABNORMAL LOW (ref 3.5–5.1)
Sodium: 138 mmol/L (ref 135–145)

## 2018-08-24 LAB — MAGNESIUM: Magnesium: 2.1 mg/dL (ref 1.7–2.4)

## 2018-08-24 MED ORDER — MOMETASONE FURO-FORMOTEROL FUM 100-5 MCG/ACT IN AERO
2.0000 | INHALATION_SPRAY | Freq: Two times a day (BID) | RESPIRATORY_TRACT | Status: DC
  Filled 2018-08-24: qty 8.8

## 2018-08-24 MED ORDER — ONDANSETRON HCL 4 MG PO TABS: 4.0000 mg | ORAL_TABLET | Freq: Four times a day (QID) | ORAL | Status: DC | PRN

## 2018-08-24 MED ORDER — ENOXAPARIN SODIUM 40 MG/0.4ML ~~LOC~~ SOLN: 40.0000 mg | SUBCUTANEOUS | Status: DC

## 2018-08-24 MED ORDER — ESCITALOPRAM OXALATE 10 MG PO TABS
10.0000 mg | ORAL_TABLET | Freq: Every day | ORAL | Status: DC
  Administered 2018-08-24: 10 mg via ORAL
  Filled 2018-08-24: qty 1

## 2018-08-24 MED ORDER — LOPERAMIDE HCL 2 MG PO CAPS: 2.0000 mg | ORAL_CAPSULE | ORAL | Status: DC | PRN

## 2018-08-24 MED ORDER — POTASSIUM CHLORIDE CRYS ER 20 MEQ PO TBCR
40.0000 meq | EXTENDED_RELEASE_TABLET | Freq: Once | ORAL | Status: AC
  Administered 2018-08-24: 40 meq via ORAL
  Filled 2018-08-24: qty 2

## 2018-08-24 MED ORDER — ATORVASTATIN CALCIUM 20 MG PO TABS
40.0000 mg | ORAL_TABLET | Freq: Every day | ORAL | Status: DC
  Administered 2018-08-24: 10:00:00 40 mg via ORAL
  Filled 2018-08-24: qty 2

## 2018-08-24 MED ORDER — ACETAMINOPHEN 650 MG RE SUPP: 650.0000 mg | Freq: Four times a day (QID) | RECTAL | Status: DC | PRN

## 2018-08-24 MED ORDER — LISINOPRIL 10 MG PO TABS
20.0000 mg | ORAL_TABLET | Freq: Two times a day (BID) | ORAL | Status: DC
  Administered 2018-08-24: 10:00:00 20 mg via ORAL
  Filled 2018-08-24: qty 2

## 2018-08-24 MED ORDER — METOPROLOL SUCCINATE ER 25 MG PO TB24
25.0000 mg | ORAL_TABLET | Freq: Every day | ORAL | Status: DC
  Administered 2018-08-24: 10:00:00 25 mg via ORAL
  Filled 2018-08-24: qty 1

## 2018-08-24 MED ORDER — POTASSIUM CHLORIDE 10 MEQ/100ML IV SOLN
10.0000 meq | Freq: Once | INTRAVENOUS | Status: AC
  Administered 2018-08-24: 05:00:00 10 meq via INTRAVENOUS

## 2018-08-24 MED ORDER — DICYCLOMINE HCL 20 MG PO TABS
20.0000 mg | ORAL_TABLET | Freq: Three times a day (TID) | ORAL | Status: DC
  Filled 2018-08-24 (×3): qty 1

## 2018-08-24 MED ORDER — MELATONIN 5 MG PO TABS
20.0000 mg | ORAL_TABLET | Freq: Every day | ORAL | Status: DC
  Filled 2018-08-24 (×2): qty 4

## 2018-08-24 MED ORDER — DICYCLOMINE HCL 20 MG PO TABS: 20.0000 mg | ORAL_TABLET | Freq: Three times a day (TID) | ORAL | 0 refills | Status: AC

## 2018-08-24 MED ORDER — ALBUTEROL SULFATE (2.5 MG/3ML) 0.083% IN NEBU: 3.0000 mL | INHALATION_SOLUTION | RESPIRATORY_TRACT | Status: DC | PRN

## 2018-08-24 MED ORDER — ONDANSETRON HCL 4 MG/2ML IJ SOLN: 4.0000 mg | Freq: Four times a day (QID) | INTRAMUSCULAR | Status: DC | PRN

## 2018-08-24 MED ORDER — ACETAMINOPHEN 325 MG PO TABS: 650.0000 mg | ORAL_TABLET | Freq: Four times a day (QID) | ORAL | Status: DC | PRN

## 2018-08-24 MED ORDER — PANTOPRAZOLE SODIUM 20 MG PO TBEC
20.0000 mg | DELAYED_RELEASE_TABLET | Freq: Two times a day (BID) | ORAL | Status: DC
  Administered 2018-08-24: 10:00:00 20 mg via ORAL
  Filled 2018-08-24 (×2): qty 1

## 2018-08-24 NOTE — Discharge Summary (Signed)
[facility] NAME: Dean Tanner    MR#:  [id number]  DATE OF BIRTH:  04/21/1956  DATE OF ADMISSION:  08/23/2018 ADMITTING PHYSICIAN: Lance Coon, MD  DATE OF DISCHARGE: 08/24/2018 11:23 AM  PRIMARY CARE PHYSICIAN: Hortencia Pilar, MD    ADMISSION DIAGNOSIS:  Hypocalcemia [E83.51] Hypokalemia [E87.6] Hypomagnesemia [E83.42] Chronic diarrhea [K52.9]  DISCHARGE DIAGNOSIS:  Principal Problem:   Electrolyte depletion Active Problems:   Arteriosclerosis of coronary artery   Benign essential HTN   Acid reflux   Chronic diarrhea   SECONDARY DIAGNOSIS:   Past Medical History:  Diagnosis Date  . Acid reflux 12/29/2011  . Arteriosclerosis of coronary artery 12/29/2011   Overview:  Diffuse three vessel,mild  . Benign essential HTN 02/12/2014  . Breathlessness on exertion 06/09/2014  . CAFL (chronic airflow limitation) ([place]) 04/30/2013   Overview:  Spirometry 12/14 FVC 89% FEV1 79% FEV1/FVC 66% c/w moderate obstructive disease Dr. Vella Kohler   . COPD (chronic obstructive pulmonary disease) ([place])   . H/O alcohol abuse 12/29/2011  . Hernia of anterior abdominal wall 12/29/2011  . Hypertension    controlled on meds  . Kidney cysts 12/29/2011    HOSPITAL COURSE:   1.  Hypomagnesemia and hypokalemia.  Magnesium 4 g IV given yesterday and repeat level in the normal range.  Potassium also replaced.  Potassium slightly low today but better.  Discontinue hygroton.  This is not a good medication for him.  This can cause both hypomagnesemia and hypokalemia.  Also advised not to drink alcohol because this can cause hypomagnesemia 2.  Chronic diarrhea.  I advised not to drink alcohol because this could cause chronic diarrhea.  The magnesium orally that he is taking could cause diarrhea.  The PPI could cause diarrhea.  Trial of Bentyl to see if this helps just in case this is irritable bowel. 3.  Hypertension.  Continue usual medications except discontinue  hygroton. 4.  Obesity with a BMI of 34.97.  Weight loss needed 5.  COPD continue medications  DISCHARGE CONDITIONS:   Satisfactory  CONSULTS OBTAINED:  None  DRUG ALLERGIES:   Allergies  Allergen Reactions  . Codeine Other (See Comments)    Reaction: "messes up his intestinal tract" so he doesn't like taking it.     DISCHARGE MEDICATIONS:   Allergies as of 08/24/2018      Reactions   Codeine Other (See Comments)   Reaction: "messes up his intestinal tract" so he doesn't like taking it.       Medication List    STOP taking these medications   chlorthalidone 25 MG tablet Commonly known as:  HYGROTON   magnesium oxide 400 MG tablet Commonly known as:  MAG-OX   omeprazole 20 MG capsule Commonly known as:  PRILOSEC     TAKE these medications   acetaminophen 650 MG CR tablet Commonly known as:  TYLENOL Take 650 mg by mouth. One tablet every 8 to 12 hours as needed for joint pain.   Advair Diskus 100-50 MCG/DOSE Aepb Generic drug:  Fluticasone-Salmeterol Inhale 1 puff into the lungs daily as needed (shortness of breath).   albuterol 108 (90 Base) MCG/ACT inhaler Commonly known as:  VENTOLIN HFA Inhale 2 puffs into the lungs every 4 (four) hours as needed.   atorvastatin 40 MG tablet Commonly known as:  LIPITOR Take 40 mg by mouth daily.   cetirizine 10 MG tablet Commonly known as:  ZYRTEC Take 10 mg by mouth at bedtime.  clobetasol cream 0.05 % Commonly known as:  TEMOVATE Apply 1 application topically as needed (dermatitis).   Clocortolone Pivalate 0.1 % cream Commonly known as:  CLODERM Apply 1 application topically as needed.   dicyclomine 20 MG tablet Commonly known as:  BENTYL Take 1 tablet (20 mg total) by mouth 4 (four) times daily -  before meals and at bedtime.   escitalopram 10 MG tablet Commonly known as:  LEXAPRO Take 10 mg by mouth daily.   Eucrisa 2 % Oint Generic drug:  Crisaborole Apply 1 application topically 2 (two) times daily  as needed.   fluticasone 50 MCG/ACT nasal spray Commonly known as:  FLONASE Place 2 sprays into the nose daily.   lisinopril 20 MG tablet Commonly known as:  ZESTRIL Take 20 mg by mouth 2 (two) times daily. am   loperamide 2 MG capsule Commonly known as:  IMODIUM Take 2 mg by mouth as needed for diarrhea or loose stools.   Melatonin 10 MG Tabs Take 20 mg by mouth at bedtime.   metoprolol succinate 25 MG 24 hr tablet Commonly known as:  TOPROL-XL Take 25 mg by mouth daily.   multivitamin with minerals Tabs tablet Take 1 tablet by mouth daily.   mupirocin ointment 2 % Commonly known as:  BACTROBAN Apply 1 application topically 2 (two) times daily as needed.   nitroGLYCERIN 0.4 MG SL tablet Commonly known as:  NITROSTAT Place 0.4 mg under the tongue every 5 (five) minutes as needed for chest pain.   pimecrolimus 1 % cream Commonly known as:  ELIDEL Apply 1 application topically 2 (two) times daily as needed.   potassium chloride SA 20 MEQ tablet Commonly known as:  K-DUR Take 20 mEq by mouth daily.   Sernivo 0.05 % Emul Generic drug:  Betamethasone Dipropionate Apply 1 application topically as needed (dermatitis).        DISCHARGE INSTRUCTIONS:   Follow-up PMD 5 days  If you experience worsening of your admission symptoms, develop shortness of breath, life threatening emergency, suicidal or homicidal thoughts you must seek medical attention immediately by calling 911 or calling your MD immediately  if symptoms less severe.  You Must read complete instructions/literature along with all the possible adverse reactions/side effects for all the Medicines you take and that have been prescribed to you. Take any new Medicines after you have completely understood and accept all the possible adverse reactions/side effects.   Please note  You were cared for by a hospitalist during your hospital stay. If you have any questions about your discharge medications or the care  you received while you were in the hospital after you are discharged, you can call the unit and asked to speak with the hospitalist on call if the hospitalist that took care of you is not available. Once you are discharged, your primary care physician will handle any further medical issues. Please note that NO REFILLS for any discharge medications will be authorized once you are discharged, as it is imperative that you return to your primary care physician (or establish a relationship with a primary care physician if you do not have one) for your aftercare needs so that they can reassess your need for medications and monitor your lab values.    Today   CHIEF COMPLAINT:   Chief Complaint  Patient presents with  . Abnormal Lab    HISTORY OF PRESENT ILLNESS:  Dean Tanner  is a 63 y.o. male sent in with low magnesium   VITAL  SIGNS:  Blood pressure (!) 159/86, pulse 73, temperature 97.8 F (36.6 C), resp. rate 16, height 5\' 8"  (1.727 m), weight 104.3 kg, SpO2 100 %.    PHYSICAL EXAMINATION:  GENERAL:  63 y.o.-year-old patient lying in the bed with no acute distress.  EYES: Pupils equal, round, reactive to light and accommodation. No scleral icterus. Extraocular muscles intact.  HEENT: Head atraumatic, normocephalic. Oropharynx and nasopharynx clear.  NECK:  Supple, no jugular venous distention. No thyroid enlargement, no tenderness.  LUNGS: Normal breath sounds bilaterally, no wheezing, rales,rhonchi or crepitation. No use of accessory muscles of respiration.  CARDIOVASCULAR: S1, S2 normal. No murmurs, rubs, or gallops.  ABDOMEN: Soft, non-tender, non-distended. Bowel sounds present. No organomegaly or mass.  EXTREMITIES: No pedal edema, cyanosis, or clubbing.  NEUROLOGIC: Cranial nerves II through XII are intact. Muscle strength 5/5 in all extremities. Sensation intact. Gait not checked.  PSYCHIATRIC: The patient is alert and oriented x 3.  SKIN: No obvious rash, lesion, or ulcer.    DATA REVIEW:   CBC Recent Labs  Lab 08/24/18 0632  WBC 9.6  HGB 12.6*  HCT 36.8*  PLT 194    Chemistries  Recent Labs  Lab 08/23/18 2112 08/24/18 0632  NA 141 138  K 2.7* 3.1*  CL 103 102  CO2 27 26  GLUCOSE 128* 110*  BUN 18 19  CREATININE 1.07 0.94  CALCIUM 7.9* 7.9*  MG 0.8* 2.1  AST 23  --   ALT 16  --   ALKPHOS 76  --   BILITOT 0.9  --      Management plans discussed with the patient, and he is in agreement.  CODE STATUS:     Code Status Orders  (From admission, onward)         Start     Ordered   08/24/18 0159  Full code  Continuous     08/24/18 0158        Code Status History    Date Active Date Inactive Code Status Order ID Comments User Context   06/29/2018 0110 06/29/2018 2104 Full Code [id number]  Lance Coon, MD Inpatient    Advance Directive Documentation     Most Recent Value  Type of Advance Directive  Living will  Pre-existing out of facility DNR order (yellow form or pink MOST form)  -  "MOST" Form in Place?  -      TOTAL TIME TAKING CARE OF THIS PATIENT: 35 minutes.    Loletha Grayer M.D on 08/24/2018 at 1:10 PM  Between 7am to 6pm - Pager - [phone]  After 6pm go to [email or web address] - password [email or web address]  [facility] Office  [phone]  CC: Primary care physician; Hortencia Pilar, MD

## 2018-08-24 NOTE — Care Management Obs Status (Signed)
[facility] NOTIFICATION   Patient Details  Name: REGGINALD PASK MRN: [id number] Date of Birth: March 22, 1956   Medicare Observation Status Notification Given:  No  Patient only been hospitalized 10 hours does not require OBS letter.   Latanya Maudlin, RN 08/24/2018, 9:43 AM

## 2018-08-24 NOTE — ED Notes (Signed)
ED TO INPATIENT HANDOFF REPORT  ED Nurse Name and Phone #: Gwynn Burly Name/Age/Gender Dean Tanner 63 y.o. male Room/Bed: ED25A/ED25A  Code Status   Code Status: Prior  Home/SNF/Other Home Patient oriented to: self, place, time and situation Is this baseline? Yes   Triage Complete: Triage complete  Chief Complaint low potassium; heart palpitations  Triage Note Pt was called by pmb for low potassium and mag that he had drawn today. Went to pmd for lethargy, palpitation and weakness. Hx of the same due to chronic diarrhea.    Allergies Allergies  Allergen Reactions  . Codeine Other (See Comments)    Reaction: "messes up his intestinal tract" so he doesn't like taking it.     Level of Care/Admitting Diagnosis ED Disposition    ED Disposition Condition [facility] Area: [place] [[id number]]  Level of Care: Med-Surg [16]  Covid Evaluation: N/A  Diagnosis: Electrolyte depletion [093818]  Admitting Physician: Lance Coon [[id number]]  Attending Physician: Lance Coon [[id number]]  PT Class (Do Not Modify): Observation [104]  PT Acc Code (Do Not Modify): Observation [10022]       B Medical/Surgery History Past Medical History:  Diagnosis Date  . Acid reflux 12/29/2011  . Arteriosclerosis of coronary artery 12/29/2011   Overview:  Diffuse three vessel,mild  . Benign essential HTN 02/12/2014  . Breathlessness on exertion 06/09/2014  . CAFL (chronic airflow limitation) ([place]) 04/30/2013   Overview:  Spirometry 12/14 FVC 89% FEV1 79% FEV1/FVC 66% c/w moderate obstructive disease Dr. Vella Kohler   . COPD (chronic obstructive pulmonary disease) ([place])   . H/O alcohol abuse 12/29/2011  . Hernia of anterior abdominal wall 12/29/2011  . Hypertension    controlled on meds  . Kidney cysts 12/29/2011   Past Surgical History:  Procedure Laterality Date  . CARDIAC CATHETERIZATION    . CHOLECYSTECTOMY    . COLONOSCOPY  02/08/2013  .  ESOPHAGOGASTRODUODENOSCOPY  10/03/2011  . ESOPHAGOGASTRODUODENOSCOPY (EGD) WITH PROPOFOL N/A 01/09/2015   Procedure: ESOPHAGOGASTRODUODENOSCOPY (EGD) WITH PROPOFOL;  Surgeon: Lucilla Lame, MD;  Location: [facility];  Service: Endoscopy;  Laterality: N/A;  . HERNIA REPAIR    . laser benign laryngeal lesion       A IV Location/Drains/Wounds Patient Lines/Drains/Airways Status   Active Line/Drains/Airways    Name:   Placement date:   Placement time:   Site:   Days:   Peripheral IV 06/28/18 Left Antecubital   06/28/18    2150    Antecubital   57   Peripheral IV 08/23/18 Left Forearm   08/23/18    2118    Forearm   1          Intake/Output Last 24 hours No intake or output data in the 24 hours ending 08/24/18 0151  Labs/Imaging Results for orders placed or performed during the hospital encounter of 08/23/18 (from the past 48 hour(s))  CBC with Differential/Platelet     Status: Abnormal   Collection Time: 08/23/18  9:12 PM  Result Value Ref Range   WBC 9.3 4.0 - 10.5 K/uL   RBC 4.18 (L) 4.22 - 5.81 MIL/uL   Hemoglobin 12.9 (L) 13.0 - 17.0 g/dL   HCT 37.9 (L) 39.0 - 52.0 %   MCV 90.7 80.0 - 100.0 fL   MCH 30.9 26.0 - 34.0 pg   MCHC 34.0 30.0 - 36.0 g/dL   RDW 14.6 11.5 - 15.5 %   Platelets 180 150 - 400 K/uL   nRBC  0.0 0.0 - 0.2 %   Neutrophils Relative % 75 %   Neutro Abs 7.1 1.7 - 7.7 K/uL   Lymphocytes Relative 14 %   Lymphs Abs 1.3 0.7 - 4.0 K/uL   Monocytes Relative 6 %   Monocytes Absolute 0.5 0.1 - 1.0 K/uL   Eosinophils Relative 3 %   Eosinophils Absolute 0.3 0.0 - 0.5 K/uL   Basophils Relative 1 %   Basophils Absolute 0.1 0.0 - 0.1 K/uL   Immature Granulocytes 1 %   Abs Immature Granulocytes 0.06 0.00 - 0.07 K/uL    Comment: Performed at [facility], [address] [place], [place] [address]  Comprehensive metabolic panel     Status: Abnormal   Collection Time: 08/23/18  9:12 PM  Result Value Ref Range   Sodium 141 135 - 145 mmol/L    Potassium 2.7 (LL) 3.5 - 5.1 mmol/L    Comment: CRITICAL RESULT CALLED TO, READ BACK BY AND VERIFIED WITH LORRIE LEMONS AT 2147 08/23/2018 SMA    Chloride 103 98 - 111 mmol/L   CO2 27 22 - 32 mmol/L   Glucose, Bld 128 (H) 70 - 99 mg/dL   BUN 18 8 - 23 mg/dL   Creatinine, Ser 1.07 0.61 - 1.24 mg/dL   Calcium 7.9 (L) 8.9 - 10.3 mg/dL   Total Protein 6.8 6.5 - 8.1 g/dL   Albumin 3.7 3.5 - 5.0 g/dL   AST 23 15 - 41 U/L   ALT 16 0 - 44 U/L   Alkaline Phosphatase 76 38 - 126 U/L   Total Bilirubin 0.9 0.3 - 1.2 mg/dL   GFR calc non Af Amer >60 >60 mL/min   GFR calc Af Amer >60 >60 mL/min   Anion gap 11 5 - 15    Comment: Performed at [facility], [address] [place], [place] [address]  Magnesium     Status: Abnormal   Collection Time: 08/23/18  9:12 PM  Result Value Ref Range   Magnesium 0.8 (LL) 1.7 - 2.4 mg/dL    Comment: CRITICAL RESULT CALLED TO, READ BACK BY AND VERIFIED WITH LORRIE LEMONS AT 2147 08/23/2018 SMA Performed at [facility], [address], [place], [place] [address]    No results found.  Pending Labs [employer] (From admission, onward)    Start     Ordered   Signed and Held  CBC  (enoxaparin (LOVENOX)    CrCl >/= 30 ml/min)  Once,   R    Comments:  Baseline for enoxaparin therapy IF NOT ALREADY DRAWN.  Notify MD if PLT < 100 K.    Signed and Held   Signed and Held  Creatinine, serum  (enoxaparin (LOVENOX)    CrCl >/= 30 ml/min)  Once,   R    Comments:  Baseline for enoxaparin therapy IF NOT ALREADY DRAWN.    Signed and Held   Signed and Held  Creatinine, serum  (enoxaparin (LOVENOX)    CrCl >/= 30 ml/min)  Weekly,   R    Comments:  while on enoxaparin therapy    Signed and Held   Signed and Held  Basic metabolic panel  Tomorrow morning,   R     Signed and Held   Signed and Held  CBC  Tomorrow morning,   R     Signed and Held   Signed and Held  Magnesium  Tomorrow morning,   R     Signed and Held  Vitals/Pain Today's Vitals   08/23/18 2058 08/23/18 2109 08/23/18 2130 08/23/18 2230  BP:  (!) 147/90 (!) 145/78 130/71  Pulse:  79 81 78  Resp:  14 15 17   Temp:      TempSrc:      SpO2:  98% 98% 96%  Weight: 104.3 kg     Height: 5\' 8"  (1.727 m)     PainSc:        Isolation Precautions No active isolations  Medications Medications  potassium chloride 10 mEq in 100 mL IVPB (0 mEq Intravenous Stopped 08/23/18 2326)  magnesium sulfate IVPB 2 g 50 mL (0 g Intravenous Stopped 08/23/18 2326)  potassium chloride SA (K-DUR) CR tablet 40 mEq (40 mEq Oral Given 08/23/18 2203)  calcium gluconate 1 g/ 50 mL sodium chloride IVPB (0 g Intravenous Stopped 08/23/18 2326)  magnesium sulfate IVPB 4 g 100 mL (4 g Intravenous New Bag/Given 08/23/18 2327)    Mobility  Low fall risk   Focused Assessments    R Recommendations: See Admitting Provider Note  Report given to:   Additional Notes: none

## 2018-08-24 NOTE — Progress Notes (Signed)
Discharge instructions given to patient-escorted via wheelchair to ED parking lot.   Pt driving himself home.  No distress noted.

## 2019-07-02 ENCOUNTER — Other Ambulatory Visit: Payer: Self-pay

## 2019-07-02 DIAGNOSIS — L209 Atopic dermatitis, unspecified: Secondary | ICD-10-CM

## 2019-07-08 ENCOUNTER — Ambulatory Visit (INDEPENDENT_AMBULATORY_CARE_PROVIDER_SITE_OTHER): Payer: Medicare Other

## 2019-07-08 ENCOUNTER — Other Ambulatory Visit: Payer: Self-pay

## 2019-07-08 DIAGNOSIS — L209 Atopic dermatitis, unspecified: Secondary | ICD-10-CM

## 2019-07-10 ENCOUNTER — Other Ambulatory Visit: Payer: Self-pay

## 2019-07-10 ENCOUNTER — Ambulatory Visit (INDEPENDENT_AMBULATORY_CARE_PROVIDER_SITE_OTHER): Payer: Medicare Other

## 2019-07-10 DIAGNOSIS — L209 Atopic dermatitis, unspecified: Secondary | ICD-10-CM | POA: Diagnosis not present

## 2019-07-12 ENCOUNTER — Ambulatory Visit (INDEPENDENT_AMBULATORY_CARE_PROVIDER_SITE_OTHER): Payer: Medicare Other

## 2019-07-12 ENCOUNTER — Other Ambulatory Visit: Payer: Self-pay

## 2019-07-12 DIAGNOSIS — L209 Atopic dermatitis, unspecified: Secondary | ICD-10-CM

## 2019-07-12 NOTE — Progress Notes (Signed)
Patient treated in the lightbox for 2 minutes and 58 seconds. Patient tolerated treatment well.

## 2019-07-15 ENCOUNTER — Other Ambulatory Visit: Payer: Self-pay

## 2019-07-15 ENCOUNTER — Ambulatory Visit (INDEPENDENT_AMBULATORY_CARE_PROVIDER_SITE_OTHER): Payer: Medicare Other

## 2019-07-15 DIAGNOSIS — L209 Atopic dermatitis, unspecified: Secondary | ICD-10-CM

## 2019-07-15 NOTE — Progress Notes (Signed)
Patient treated in the lightbox/NBUVB for 3 minutes and 8 seconds.

## 2019-07-17 ENCOUNTER — Other Ambulatory Visit: Payer: Self-pay

## 2019-07-17 ENCOUNTER — Ambulatory Visit (INDEPENDENT_AMBULATORY_CARE_PROVIDER_SITE_OTHER): Payer: Medicare Other

## 2019-07-17 DIAGNOSIS — L209 Atopic dermatitis, unspecified: Secondary | ICD-10-CM | POA: Diagnosis not present

## 2019-07-17 NOTE — Progress Notes (Signed)
Patient treated today in the lightbox/NBUVB for 3 minutes and 18 seconds.

## 2019-07-19 ENCOUNTER — Other Ambulatory Visit: Payer: Self-pay

## 2019-07-19 ENCOUNTER — Ambulatory Visit (INDEPENDENT_AMBULATORY_CARE_PROVIDER_SITE_OTHER): Payer: Medicare Other

## 2019-07-19 DIAGNOSIS — L209 Atopic dermatitis, unspecified: Secondary | ICD-10-CM | POA: Diagnosis not present

## 2019-07-19 NOTE — Progress Notes (Signed)
Patient treated in the Lightbox for 3 minutes and 28 seconds.

## 2019-07-22 ENCOUNTER — Other Ambulatory Visit: Payer: Self-pay

## 2019-07-22 ENCOUNTER — Ambulatory Visit (INDEPENDENT_AMBULATORY_CARE_PROVIDER_SITE_OTHER): Payer: Medicare Other

## 2019-07-22 DIAGNOSIS — L209 Atopic dermatitis, unspecified: Secondary | ICD-10-CM

## 2019-07-22 NOTE — Progress Notes (Signed)
Patient treated in the lightbox/NBUVB for 3 minutes and 48 seconds.  

## 2019-07-24 ENCOUNTER — Ambulatory Visit (INDEPENDENT_AMBULATORY_CARE_PROVIDER_SITE_OTHER): Payer: Medicare Other

## 2019-07-24 ENCOUNTER — Other Ambulatory Visit: Payer: Self-pay

## 2019-07-24 DIAGNOSIS — L209 Atopic dermatitis, unspecified: Secondary | ICD-10-CM

## 2019-07-24 NOTE — Progress Notes (Signed)
Patient treated in the lightbox/NBUVB for 4 minutes and 8 seconds.  

## 2019-07-26 ENCOUNTER — Other Ambulatory Visit: Payer: Self-pay

## 2019-07-26 ENCOUNTER — Ambulatory Visit (INDEPENDENT_AMBULATORY_CARE_PROVIDER_SITE_OTHER): Payer: Medicare Other

## 2019-07-26 DIAGNOSIS — L209 Atopic dermatitis, unspecified: Secondary | ICD-10-CM | POA: Diagnosis not present

## 2019-07-26 NOTE — Progress Notes (Signed)
Pt treated in the light box/NBUVB today for  4 minutes and 38 seconds.   I, Marye Round, CMA, am acting as scribe for [employer], [place] .

## 2019-07-29 ENCOUNTER — Other Ambulatory Visit: Payer: Self-pay

## 2019-07-29 ENCOUNTER — Ambulatory Visit (INDEPENDENT_AMBULATORY_CARE_PROVIDER_SITE_OTHER): Payer: Medicare Other

## 2019-07-29 DIAGNOSIS — L209 Atopic dermatitis, unspecified: Secondary | ICD-10-CM | POA: Diagnosis not present

## 2019-07-29 NOTE — Progress Notes (Signed)
Patient treated in the Lightbox/NBUVB for 4 minutes and 48 seconds.

## 2019-07-31 ENCOUNTER — Ambulatory Visit (INDEPENDENT_AMBULATORY_CARE_PROVIDER_SITE_OTHER): Payer: Medicare Other

## 2019-07-31 ENCOUNTER — Other Ambulatory Visit: Payer: Self-pay

## 2019-07-31 DIAGNOSIS — L209 Atopic dermatitis, unspecified: Secondary | ICD-10-CM

## 2019-07-31 NOTE — Progress Notes (Signed)
Patient treated in the lightbox/NBUVB for 4 minutes and 58 seconds.

## 2019-08-02 ENCOUNTER — Other Ambulatory Visit: Payer: Self-pay

## 2019-08-02 ENCOUNTER — Ambulatory Visit (INDEPENDENT_AMBULATORY_CARE_PROVIDER_SITE_OTHER): Payer: Medicare Other

## 2019-08-02 DIAGNOSIS — L209 Atopic dermatitis, unspecified: Secondary | ICD-10-CM

## 2019-08-02 NOTE — Progress Notes (Signed)
Patient treated for 5 minutes and 8 seconds in the lightbox/NBUVB.

## 2019-08-06 ENCOUNTER — Other Ambulatory Visit: Payer: Self-pay

## 2019-08-06 ENCOUNTER — Ambulatory Visit (INDEPENDENT_AMBULATORY_CARE_PROVIDER_SITE_OTHER): Payer: Medicare Other

## 2019-08-06 DIAGNOSIS — L209 Atopic dermatitis, unspecified: Secondary | ICD-10-CM

## 2019-08-06 NOTE — Progress Notes (Signed)
Patient treated in the lightbox/NBUVB for 5 minutes and 18 seconds.   

## 2019-08-08 ENCOUNTER — Other Ambulatory Visit: Payer: Self-pay

## 2019-08-08 ENCOUNTER — Ambulatory Visit (INDEPENDENT_AMBULATORY_CARE_PROVIDER_SITE_OTHER): Payer: Medicare Other

## 2019-08-08 DIAGNOSIS — L209 Atopic dermatitis, unspecified: Secondary | ICD-10-CM | POA: Diagnosis not present

## 2019-08-08 NOTE — Progress Notes (Signed)
Patient treated in the lightbox/NBUVB for 5 minutes and 28 seconds.   

## 2019-08-13 ENCOUNTER — Other Ambulatory Visit: Payer: Self-pay

## 2019-08-13 ENCOUNTER — Ambulatory Visit (INDEPENDENT_AMBULATORY_CARE_PROVIDER_SITE_OTHER): Payer: Medicare Other

## 2019-08-13 DIAGNOSIS — L209 Atopic dermatitis, unspecified: Secondary | ICD-10-CM | POA: Diagnosis not present

## 2019-08-13 NOTE — Progress Notes (Signed)
Patient treated in the lightbox/NBUVB for 5 minutes and 38 seconds.   

## 2019-08-15 ENCOUNTER — Other Ambulatory Visit: Payer: Self-pay

## 2019-08-15 ENCOUNTER — Ambulatory Visit (INDEPENDENT_AMBULATORY_CARE_PROVIDER_SITE_OTHER): Payer: Medicare Other

## 2019-08-15 DIAGNOSIS — L209 Atopic dermatitis, unspecified: Secondary | ICD-10-CM | POA: Diagnosis not present

## 2019-08-15 NOTE — Progress Notes (Signed)
Patient treated in the lightbox/NBUVB today for 5 minutes 48 seconds.

## 2019-09-03 ENCOUNTER — Ambulatory Visit (INDEPENDENT_AMBULATORY_CARE_PROVIDER_SITE_OTHER): Payer: Medicare Other | Admitting: Dermatology

## 2019-09-03 ENCOUNTER — Other Ambulatory Visit: Payer: Self-pay

## 2019-09-03 DIAGNOSIS — L853 Xerosis cutis: Secondary | ICD-10-CM

## 2019-09-03 DIAGNOSIS — L820 Inflamed seborrheic keratosis: Secondary | ICD-10-CM

## 2019-09-03 DIAGNOSIS — L578 Other skin changes due to chronic exposure to nonionizing radiation: Secondary | ICD-10-CM

## 2019-09-03 DIAGNOSIS — D692 Other nonthrombocytopenic purpura: Secondary | ICD-10-CM | POA: Diagnosis not present

## 2019-09-03 DIAGNOSIS — L570 Actinic keratosis: Secondary | ICD-10-CM | POA: Diagnosis not present

## 2019-09-03 DIAGNOSIS — L2089 Other atopic dermatitis: Secondary | ICD-10-CM | POA: Diagnosis not present

## 2019-09-03 DIAGNOSIS — D1801 Hemangioma of skin and subcutaneous tissue: Secondary | ICD-10-CM

## 2019-09-03 DIAGNOSIS — L821 Other seborrheic keratosis: Secondary | ICD-10-CM

## 2019-09-03 NOTE — Patient Instructions (Signed)
Wound Care Instructions  1. Cleanse wound gently with soap and water once a day then pat dry with clean gauze. Apply a thing coat of Petrolatum (petroleum jelly, "Vaseline") over the wound (unless you have an allergy to this). We recommend that you use a new, sterile tube of Vaseline. Do not pick or remove scabs. Do not remove the yellow or white "healing tissue" from the base of the wound.  2. Cover the wound with fresh, clean, nonstick gauze and secure with paper tape. You may use Band-Aids in place of gauze and tape if the would is small enough, but would recommend trimming much of the tape off as there is often too much. Sometimes Band-Aids can irritate the skin.  3. You should call the office for your biopsy report after 1 week if you have not already been contacted.  4. If you experience any problems, such as abnormal amounts of bleeding, swelling, significant bruising, significant pain, or evidence of infection, please call the office immediately.  5. FOR ADULT SURGERY PATIENTS: If you need something for pain relief you may take 1 extra strength Tylenol (acetaminophen) AND 2 Ibuprofen (200mg each) together every 4 hours as needed for pain. (do not take these if you are allergic to them or if you have a reason you should not take them.) Typically, you may only need pain medication for 1 to 3 days.   Wound Care Instructions  6. Cleanse wound gently with soap and water once a day then pat dry with clean gauze. Apply a thing coat of Petrolatum (petroleum jelly, "Vaseline") over the wound (unless you have an allergy to this). We recommend that you use a new, sterile tube of Vaseline. Do not pick or remove scabs. Do not remove the yellow or white "healing tissue" from the base of the wound.  7. Cover the wound with fresh, clean, nonstick gauze and secure with paper tape. You may use Band-Aids in place of gauze and tape if the would is small enough, but would recommend trimming much of the tape off  as there is often too much. Sometimes Band-Aids can irritate the skin.  8. You should call the office for your biopsy report after 1 week if you have not already been contacted.  9. If you experience any problems, such as abnormal amounts of bleeding, swelling, significant bruising, significant pain, or evidence of infection, please call the office immediately.  10. FOR ADULT SURGERY PATIENTS: If you need something for pain relief you may take 1 extra strength Tylenol (acetaminophen) AND 2 Ibuprofen (200mg each) together every 4 hours as needed for pain. (do not take these if you are allergic to them or if you have a reason you should not take them.) Typically, you may only need pain medication for 1 to 3 days.      

## 2019-09-03 NOTE — Progress Notes (Signed)
Follow-Up Visit   Subjective  Dean Tanner is a 64 y.o. male who presents for the following: Follow-up (Severe atopic derm - treating with Dupixent q2weeks. Was treating with NBUVB until a couple weeks ago but does not have any flares right now.) and Other (Spot on left temple and left neck that are itchy).   The following portions of the chart were reviewed this encounter and updated as appropriate:  Tobacco  Allergies  Meds  Problems  Med Hx  Surg Hx  Fam Hx      Review of Systems:  No other skin or systemic complaints except as noted in HPI or Assessment and Plan.  Objective  Well appearing patient in no apparent distress; mood and affect are within normal limits.  All skin waist up examined.  Objective  Left temple (3): Erythematous thin papules/macules with gritty scale.   Objective  Left Supraclavicular Area (2): Erythematous keratotic or waxy stuck-on papule or plaque.   Objective  Trunk and extremities: Trunk is clear today. Pink scaly patches of lower legs.  Objective  Chest - Medial [facility]): Xerosis   Assessment & Plan    Actinic Damage - diffuse scaly erythematous macules with underlying dyspigmentation - Recommend daily broad spectrum sunscreen SPF 30+ to sun-exposed areas, reapply every 2 hours as needed.  - Call for new or changing lesions.  Purpura - Violaceous macules and patches - Benign - Related to age, sun damage and/or use of blood thinners - Observe - Can use OTC arnica containing moisturizer such as Dermend Bruise Formula if desired - Call for worsening or other concerns  Hemangiomas - Red papules - Discussed benign nature - Observe - Call for any changes  Seborrheic Keratoses - Stuck-on, waxy, tan-brown papules and plaques  - Discussed benign etiology and prognosis. - Observe - Call for any changes   AK (actinic keratosis) (3) Left temple  Destruction of lesion - Left temple Complexity: simple   Destruction  method: cryotherapy   Informed consent: discussed and consent obtained   Timeout:  patient name, date of birth, surgical site, and procedure verified Lesion destroyed using liquid nitrogen: Yes   Region frozen until ice ball extended beyond lesion: Yes   Outcome: patient tolerated procedure well with no complications   Post-procedure details: wound care instructions given    Inflamed seborrheic keratosis (2) Left Supraclavicular Area  Destruction of lesion - Left Supraclavicular Area Complexity: simple   Destruction method: cryotherapy   Informed consent: discussed and consent obtained   Timeout:  patient name, date of birth, surgical site, and procedure verified Lesion destroyed using liquid nitrogen: Yes   Region frozen until ice ball extended beyond lesion: Yes   Outcome: patient tolerated procedure well with no complications   Post-procedure details: wound care instructions given    Other atopic dermatitis Trunk and extremities  Severe atopic dermatitis - Well controlled on Dupixent injections. Persistent itch and purpura from scratching, but MUCH improved and pt is pleased.  Continue topicals as needed. Will D/C light treatments for now.  Xerosis cutis Chest - Medial (Center)  Continue Dove for Sensitive Skin and Cerave cream daily  Return in about 6 months (around 03/05/2020).  I, Ashok Cordia, CMA, am acting as scribe for Sarina Ser, MD . Documentation: I have reviewed the above documentation for accuracy and completeness, and I agree with the above.  Sarina Ser, MD   Documentation: I have reviewed the above documentation for accuracy and completeness, and I agree with the  above.  Sarina Ser, MD

## 2019-09-11 ENCOUNTER — Encounter: Payer: Self-pay | Admitting: Dermatology

## 2020-01-03 ENCOUNTER — Ambulatory Visit: Payer: Medicare Other | Attending: Internal Medicine

## 2020-01-03 DIAGNOSIS — Z23 Encounter for immunization: Secondary | ICD-10-CM

## 2020-01-03 NOTE — Progress Notes (Signed)
° °  Covid-19 Vaccination Clinic  Name:  Dean Tanner    MRN: [id number] DOB: 27-Sep-1955  01/03/2020  [name] was observed post Covid-19 immunization for 15 minutes without incident. He was provided with Vaccine Information Sheet and instruction to access the V-Safe system.   [name] was instructed to call 911 with any severe reactions post vaccine:  Difficulty breathing   Swelling of face and throat   A fast heartbeat   A bad rash all over body   Dizziness and weakness      Covid-19 Vaccination Clinic  Name:  Dean Tanner    MRN: [id number] DOB: February 25, 1956  01/03/2020  [name] was observed post Covid-19 immunization for 15 minutes without incident. He was provided with Vaccine Information Sheet and instruction to access the V-Safe system.   [name] was instructed to call 911 with any severe reactions post vaccine:  Difficulty breathing   Swelling of face and throat   A fast heartbeat   A bad rash all over body   Dizziness and weakness

## 2020-03-09 ENCOUNTER — Other Ambulatory Visit: Payer: Self-pay

## 2020-03-09 ENCOUNTER — Ambulatory Visit (INDEPENDENT_AMBULATORY_CARE_PROVIDER_SITE_OTHER): Payer: Medicare Other | Admitting: Dermatology

## 2020-03-09 DIAGNOSIS — R238 Other skin changes: Secondary | ICD-10-CM | POA: Diagnosis not present

## 2020-03-09 DIAGNOSIS — L570 Actinic keratosis: Secondary | ICD-10-CM | POA: Diagnosis not present

## 2020-03-09 DIAGNOSIS — L578 Other skin changes due to chronic exposure to nonionizing radiation: Secondary | ICD-10-CM | POA: Diagnosis not present

## 2020-03-09 DIAGNOSIS — L209 Atopic dermatitis, unspecified: Secondary | ICD-10-CM

## 2020-03-09 DIAGNOSIS — L820 Inflamed seborrheic keratosis: Secondary | ICD-10-CM

## 2020-03-09 DIAGNOSIS — I781 Nevus, non-neoplastic: Secondary | ICD-10-CM

## 2020-03-09 MED ORDER — EUCRISA 2 % EX OINT: 1.0000 "application " | TOPICAL_OINTMENT | Freq: Two times a day (BID) | CUTANEOUS | 6 refills | Status: DC | PRN

## 2020-03-09 MED ORDER — TACROLIMUS 0.1 % EX OINT: TOPICAL_OINTMENT | CUTANEOUS | 6 refills | Status: DC

## 2020-03-09 NOTE — Progress Notes (Signed)
   Follow-Up Visit   Subjective  Dean Tanner is a 64 y.o. male who presents for the following: atopic dermatitis (patient currently using Dupixent 300mg /50mL SQ Q2W and CeraVe cream daily) and actinic keratosis (recheck any new or persistent lesions on the face - patient has noticed some rough patches on the forehead).  The following portions of the chart were reviewed this encounter and updated as appropriate:  Tobacco  Allergies  Meds  Problems  Med Hx  Surg Hx  Fam Hx     Review of Systems:  No other skin or systemic complaints except as noted in HPI or Assessment and Plan.  Objective  Well appearing patient in no apparent distress; mood and affect are within normal limits.  A focused examination was performed including the trunk and extremities. Relevant physical exam findings are noted in the Assessment and Plan.  Objective  Trunk, extremities: L upper arm with a  pink patch, crusted pink patch on the legs.   Objective  Nasal bridge: Dilated blood vessels  Objective  L forehead: Erythematous thin papules/macules with gritty scale.   Objective  R forehead x 1: Erythematous keratotic or waxy stuck-on papule or plaque.   Objective  L ear mid helix: Dilated vessels.  Assessment & Plan  Atopic dermatitis -severe Trunk, extremities With flare from cooler weather -  Atopic dermatitis - Severe, on Dupixent (biologic medication).  Atopic dermatitis (eczema) is a chronic, relapsing, pruritic condition that can significantly affect quality of life. It is often associated with allergic rhinitis and/or asthma and can require treatment with topical medications, phototherapy, or in severe cases a biologic medication called Dupixent.   Continue Dupixent 300mg /92mL SQ Q2W.   Start Eucrisa 2% ointment to aa's QD-BID PRN. If not covered recommend Protopic.   Other Related Medications tacrolimus (PROTOPIC) 0.1 % ointment  Telangiectasia Nasal bridge Blanching red  macule Benign appearing, observe.  AK (actinic keratosis) L forehead  Destruction of lesion - L forehead Complexity: simple   Destruction method: cryotherapy   Informed consent: discussed and consent obtained   Timeout:  patient name, date of birth, surgical site, and procedure verified Lesion destroyed using liquid nitrogen: Yes   Region frozen until ice ball extended beyond lesion: Yes   Outcome: patient tolerated procedure well with no complications   Post-procedure details: wound care instructions given    Inflamed seborrheic keratosis R forehead x 1  Destruction of lesion - R forehead x 1 Complexity: simple   Destruction method: cryotherapy   Informed consent: discussed and consent obtained   Timeout:  patient name, date of birth, surgical site, and procedure verified Lesion destroyed using liquid nitrogen: Yes   Region frozen until ice ball extended beyond lesion: Yes   Outcome: patient tolerated procedure well with no complications   Post-procedure details: wound care instructions given    [place]ous [place] L ear mid helix Benign appearing, observe  Actinic Damage - chronic, secondary to cumulative UV radiation exposure/sun exposure over time - diffuse scaly erythematous macules with underlying dyspigmentation - Recommend daily broad spectrum sunscreen SPF 30+ to sun-exposed areas, reapply every 2 hours as needed.  - Call for new or changing lesions.  Return in about 6 months (around 09/06/2020) for atopic dermatitis, AK follow up .  Luther Redo, CMA, am acting as scribe for Sarina Ser, MD .  Documentation: I have reviewed the above documentation for accuracy and completeness, and I agree with the above.  Sarina Ser, MD

## 2020-03-09 NOTE — Progress Notes (Signed)
Alternate requested

## 2020-03-10 ENCOUNTER — Encounter: Payer: Self-pay | Admitting: Dermatology

## 2020-03-23 ENCOUNTER — Other Ambulatory Visit: Payer: Self-pay

## 2020-03-23 DIAGNOSIS — L209 Atopic dermatitis, unspecified: Secondary | ICD-10-CM

## 2020-03-23 MED ORDER — TACROLIMUS 0.1 % EX OINT: TOPICAL_OINTMENT | CUTANEOUS | 6 refills | Status: DC

## 2020-06-20 ENCOUNTER — Emergency Department
Admission: EM | Admit: 2020-06-20 | Discharge: 2020-06-20 | Disposition: A | Discharge status: Home / Self Care | Payer: Medicare Other | Attending: Emergency Medicine | Admitting: Emergency Medicine

## 2020-06-20 ENCOUNTER — Other Ambulatory Visit: Payer: Self-pay

## 2020-06-20 ENCOUNTER — Emergency Department: Payer: Medicare Other

## 2020-06-20 ENCOUNTER — Encounter: Payer: Self-pay | Admitting: Emergency Medicine

## 2020-06-20 DIAGNOSIS — Z79899 Other long term (current) drug therapy: Secondary | ICD-10-CM | POA: Diagnosis not present

## 2020-06-20 DIAGNOSIS — J449 Chronic obstructive pulmonary disease, unspecified: Secondary | ICD-10-CM | POA: Insufficient documentation

## 2020-06-20 DIAGNOSIS — R1031 Right lower quadrant pain: Secondary | ICD-10-CM

## 2020-06-20 DIAGNOSIS — Z87891 Personal history of nicotine dependence: Secondary | ICD-10-CM | POA: Insufficient documentation

## 2020-06-20 DIAGNOSIS — N281 Cyst of kidney, acquired: Secondary | ICD-10-CM | POA: Diagnosis not present

## 2020-06-20 DIAGNOSIS — I10 Essential (primary) hypertension: Secondary | ICD-10-CM | POA: Diagnosis not present

## 2020-06-20 DIAGNOSIS — K5900 Constipation, unspecified: Secondary | ICD-10-CM | POA: Diagnosis not present

## 2020-06-20 LAB — COMPREHENSIVE METABOLIC PANEL
ALT: 21 U/L (ref 0–44)
AST: 22 U/L (ref 15–41)
Albumin: 4.2 g/dL (ref 3.5–5.0)
Alkaline Phosphatase: 68 U/L (ref 38–126)
Anion gap: 9 (ref 5–15)
BUN: 16 mg/dL (ref 8–23)
CO2: 24 mmol/L (ref 22–32)
Calcium: 9.2 mg/dL (ref 8.9–10.3)
Chloride: 101 mmol/L (ref 98–111)
Creatinine, Ser: 0.96 mg/dL (ref 0.61–1.24)
GFR, Estimated: >60 mL/min (ref >60)
Glucose, Bld: 121 mg/dL — ABNORMAL HIGH (ref 70–99)
Potassium: 4.2 mmol/L (ref 3.5–5.1)
Sodium: 134 mmol/L — ABNORMAL LOW (ref 135–145)
Total Bilirubin: 1.1 mg/dL (ref 0.3–1.2)
Total Protein: 6.9 g/dL (ref 6.5–8.1)

## 2020-06-20 LAB — CBC
HCT: 42.5 % (ref 39.0–52.0)
Hemoglobin: 14.4 g/dL (ref 13.0–17.0)
MCH: 30.6 pg (ref 26.0–34.0)
MCHC: 33.9 g/dL (ref 30.0–36.0)
MCV: 90.4 fL (ref 80.0–100.0)
Platelets: 182 10*3/uL (ref 150–400)
RBC: 4.7 MIL/uL (ref 4.22–5.81)
RDW: 12.2 % (ref 11.5–15.5)
WBC: 7.1 10*3/uL (ref 4.0–10.5)
nRBC: 0 % (ref 0.0–0.2)

## 2020-06-20 LAB — URINALYSIS, COMPLETE (UACMP) WITH MICROSCOPIC
Bacteria, UA: NONE SEEN
Bilirubin Urine: NEGATIVE
Glucose, UA: NEGATIVE mg/dL
Hgb urine dipstick: NEGATIVE
Ketones, ur: NEGATIVE mg/dL
Leukocytes,Ua: NEGATIVE
Nitrite: NEGATIVE
Protein, ur: NEGATIVE mg/dL
Specific Gravity, Urine: 1.005 (ref 1.005–1.030)
Squamous Epithelial / HPF: NONE SEEN (ref 0–5)
pH: 7 (ref 5.0–8.0)

## 2020-06-20 LAB — LIPASE, BLOOD: Lipase: 26 U/L (ref 11–51)

## 2020-06-20 MED ORDER — HYDROCODONE-ACETAMINOPHEN 5-325 MG PO TABS: 1.0000 | ORAL_TABLET | ORAL | 0 refills | Status: AC | PRN

## 2020-06-20 MED ORDER — SENNOSIDES-DOCUSATE SODIUM 8.6-50 MG PO TABS: 1.0000 | ORAL_TABLET | Freq: Every day | ORAL | 0 refills | Status: AC

## 2020-06-20 MED ORDER — IOHEXOL 300 MG/ML  SOLN
100.0000 mL | Freq: Once | INTRAMUSCULAR | Status: AC | PRN
  Administered 2020-06-20: 100 mL via INTRAVENOUS
  Filled 2020-06-20: qty 100

## 2020-06-20 MED ORDER — MELOXICAM 15 MG PO TABS: 15.0000 mg | ORAL_TABLET | Freq: Every day | ORAL | 0 refills | Status: AC

## 2020-06-20 NOTE — ED Notes (Signed)
Pt with pain in RLQ of abdomen x 2 day that wakes him up. Pt denies frequency an burning with urination, denies blood in urine. Last BM yesterday.

## 2020-06-20 NOTE — ED Notes (Signed)
Pt walked by self to bathroom to give urine sample. Gait steady.

## 2020-06-20 NOTE — ED Triage Notes (Signed)
Pt in w/RLQ sharp, burning pain that began mo's ago, but has progressively gotten worse x 2 days. Does have problems w/constipation, last BM yesterday. Denies any n/v/d or urinary problems. Hx cholecystectomy as well. Afebrile. Pain described as "muscle tightening"

## 2020-06-20 NOTE — ED Provider Notes (Signed)
[facility] Emergency Department Provider Note  ____________________________________________  Time seen: Approximately 12:46 PM  I have reviewed the triage vital signs and the nursing notes.   HISTORY  Chief Complaint Abdominal Pain (RLQ )    HPI Dean Tanner is a 65 y.o. male presents the emergency department complaining of right lower quadrant abdominal pain.  Patient has had some intermittent right lower quadrant pain over the past several months.  He states that typically is pretty mild, very intermittent in nature.  Over the past 2 days he has had constant worsening pain.  He states that sometimes the pain is so severe that he has to stop, bend over to try to relieve the pain.  He has had some problems with constipation off and on.  He states that he has been constipated over the past 2 weeks.  Patient denies any dysuria, polyuria, hematuria.  No history of nephrolithiasis.  Patient has had his gallbladder removed but still has his appendix.  Patient has had again no emesis.  No blood in his stool.  No history of colitis, Crohn's, irritable bowel syndrome.         Past Medical History:  Diagnosis Date  . Acid reflux 12/29/2011  . Arteriosclerosis of coronary artery 12/29/2011   Overview:  Diffuse three vessel,mild  . Benign essential HTN 02/12/2014  . Breathlessness on exertion 06/09/2014  . CAFL (chronic airflow limitation) ([place]) 04/30/2013   Overview:  Spirometry 12/14 FVC 89% FEV1 79% FEV1/FVC 66% c/w moderate obstructive disease Dr. Vella Kohler   . COPD (chronic obstructive pulmonary disease) ([place])   . H/O alcohol abuse 12/29/2011  . Hernia of anterior abdominal wall 12/29/2011  . Hypertension    controlled on meds  . Kidney cysts 12/29/2011    Patient Active Problem List   Diagnosis Date Noted  . Electrolyte depletion 08/23/2018  . Chronic diarrhea 06/28/2018  . Hypomagnesemia 06/28/2018  . Abnormal findings-gastrointestinal tract   . Abdominal pain,  right upper quadrant   . Duodenitis   . Abdominal pain, epigastric   . Gastritis   . Awareness of heartbeats 07/22/2014  . Breathlessness on exertion 06/09/2014  . 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 Benign essential HTN 02/12/2014  . Ankle edema 01/09/2014  . CAFL (chronic airflow limitation) ([place]) 04/30/2013  . Acute bronchospasm 04/02/2013  . Arteriosclerosis of coronary artery 12/29/2011  . Acid reflux 12/29/2011  . H/O alcohol abuse 12/29/2011  . Kidney cysts 12/29/2011  . Diagnostic skin and sensitization tests 12/29/2011  . Prenatal consult 12/29/2011  . Hernia of anterior abdominal wall 12/29/2011    Past Surgical History:  Procedure Laterality Date  . CARDIAC CATHETERIZATION    . CHOLECYSTECTOMY    . COLONOSCOPY  02/08/2013  . ESOPHAGOGASTRODUODENOSCOPY  10/03/2011  . ESOPHAGOGASTRODUODENOSCOPY (EGD) WITH PROPOFOL N/A 01/09/2015   Procedure: ESOPHAGOGASTRODUODENOSCOPY (EGD) WITH PROPOFOL;  Surgeon: Lucilla Lame, MD;  Location: [place];  Service: Endoscopy;  Laterality: N/A;  . HERNIA REPAIR    . laser benign laryngeal lesion      Prior to Admission medications   Medication Sig Start Date End Date Taking? Authorizing Provider  HYDROcodone-acetaminophen (NORCO/VICODIN) 5-325 MG tablet Take 1 tablet by mouth every 4 (four) hours as needed for moderate pain. 06/20/20  Yes Cuthriell, Charline Bills, PA-C  meloxicam (MOBIC) 15 MG tablet Take 1 tablet (15 mg total) by mouth daily. 06/20/20  Yes Cuthriell, Charline Bills, PA-C  senna-docusate (SENOKOT-S) 8.6-50 MG tablet Take 1 tablet by mouth  daily. 06/20/20  Yes Cuthriell, Charline Bills, PA-C  acetaminophen (TYLENOL) 650 MG CR tablet Take 650 mg by mouth. One tablet every 8 to 12 hours as needed for joint pain. 05/10/18   [provider]  ADVAIR DISKUS 100-50 MCG/DOSE AEPB Inhale 1 puff into the lungs daily as needed (shortness of breath).     [provider]  albuterol (VENTOLIN  HFA) 108 (90 Base) MCG/ACT inhaler Inhale 2 puffs into the lungs every 4 (four) hours as needed. 07/16/18 07/16/19  [provider]  atorvastatin (LIPITOR) 40 MG tablet Take 40 mg by mouth daily.  10/08/14   [provider]  Betamethasone Dipropionate (SERNIVO) 0.05 % EMUL Apply 1 application topically as needed (dermatitis).    [provider]  cetirizine (ZYRTEC) 10 MG tablet Take 10 mg by mouth at bedtime.     [provider]  clobetasol cream (TEMOVATE) 3.01 % Apply 1 application topically as needed (dermatitis).    [provider]  Clocortolone Pivalate (CLODERM) 0.1 % cream Apply 1 application topically as needed. 10/10/17   [provider]  dicyclomine (BENTYL) 20 MG tablet Take 1 tablet (20 mg total) by mouth 4 (four) times daily -  before meals and at bedtime. 08/24/18   Loletha Grayer, MD  escitalopram (LEXAPRO) 10 MG tablet Take 10 mg by mouth daily.    [provider]  lisinopril (PRINIVIL,ZESTRIL) 20 MG tablet Take 20 mg by mouth 2 (two) times daily. am 12/18/14   [provider]  loperamide (IMODIUM) 2 MG capsule Take 2 mg by mouth as needed for diarrhea or loose stools.    [provider]  Melatonin 10 MG TABS Take 20 mg by mouth at bedtime.    [provider]  metoprolol succinate (TOPROL-XL) 25 MG 24 hr tablet Take 25 mg by mouth daily.    [provider]  Multiple Vitamin (MULTIVITAMIN WITH MINERALS) TABS tablet Take 1 tablet by mouth daily.     [provider]  mupirocin ointment (BACTROBAN) 2 % Apply 1 application topically 2 (two) times daily as needed. 03/25/18   [provider]  nitroGLYCERIN (NITROSTAT) 0.4 MG SL tablet Place 0.4 mg under the tongue every 5 (five) minutes as needed for chest pain.  06/12/14   [provider]  pimecrolimus (ELIDEL) 1 % cream Apply 1 application topically 2 (two) times daily as needed. 03/25/18   [provider]   potassium chloride SA (K-DUR,KLOR-CON) 20 MEQ tablet Take 20 mEq by mouth daily.    [provider]  tacrolimus (PROTOPIC) 0.1 % ointment Apply to affected areas up to twice a day as needed. 03/23/20   Ralene Bathe, MD    Allergies Codeine  Family History  Problem Relation Age of Onset  . Coronary artery disease Father   . Colon polyps Father   . Gallbladder disease Father   . Coronary artery disease Mother   . Hypertension Mother   . Heart failure Mother   . Gallbladder disease Brother   . Colon polyps Brother     Social History Social History   Tobacco Use  . Smoking status: Former Smoker    Packs/day: 1.00    Years: 35.00    Pack years: 35.00  . Smokeless tobacco: Former Systems developer    Types: Snuff  Substance Use Topics  . Alcohol use: Yes    Alcohol/week: 0.0 standard drinks  . Drug use: No     Review of Systems  Constitutional: No fever/chills  Eyes: No visual changes. No discharge ENT: No upper respiratory complaints. Cardiovascular: no chest pain. Respiratory: no cough. No SOB. Gastrointestinal: Right lower quadrant abdominal pain no nausea, no vomiting.  No diarrhea.  No constipation. Genitourinary: Negative for dysuria. No hematuria Musculoskeletal: Negative for musculoskeletal pain. Skin: Negative for rash, abrasions, lacerations, ecchymosis. Neurological: Negative for headaches, focal weakness or numbness.  10 System ROS otherwise negative.  ____________________________________________   PHYSICAL EXAM:  VITAL SIGNS: ED Triage Vitals [06/20/20 0954]  Enc Vitals Group     BP 127/78     Pulse Rate (!) 54     Resp 18     Temp 98.6 F (37 C)     Temp Source Oral     SpO2 99 %     Weight 218 lb (98.9 kg)     Height      Head Circumference      Peak Flow      Pain Score      Pain Loc      Pain Edu?      Excl. in [place]?      Constitutional: Alert and oriented. Well appearing and in no acute distress. Eyes: Conjunctivae are normal.  PERRL. EOMI. Head: Atraumatic. ENT:      Ears:       Nose: No congestion/rhinnorhea.      Mouth/Throat: Mucous membranes are moist.  Neck: No stridor.    Cardiovascular: Normal rate, regular rhythm. Normal S1 and S2.  Good peripheral circulation. Respiratory: Normal respiratory effort without tachypnea or retractions. Lungs CTAB. Good air entry to the bases with no decreased or absent breath sounds. Gastrointestinal: No visible external abdominal findings.  Bowel sounds 4 quadrants. Soft and nontender to palpation all quadrants.. No guarding or rigidity. No palpable masses. No distention. No CVA tenderness. Musculoskeletal: Full range of motion to all extremities. No gross deformities appreciated. Neurologic:  Normal speech and language. No gross focal neurologic deficits are appreciated.  Skin:  Skin is warm, dry and intact. No rash noted. Psychiatric: Mood and affect are normal. Speech and behavior are normal. Patient exhibits appropriate insight and judgement.   ____________________________________________   LABS (all labs ordered are listed, but only abnormal results are displayed)  Labs Reviewed  COMPREHENSIVE METABOLIC PANEL - Abnormal; Notable for the following components:      Result Value   Sodium 134 (*)    Glucose, Bld 121 (*)    All other components within normal limits  URINALYSIS, COMPLETE (UACMP) WITH MICROSCOPIC - Abnormal; Notable for the following components:   Color, Urine STRAW (*)    APPearance CLEAR (*)    All other components within normal limits  LIPASE, BLOOD  CBC   ____________________________________________  EKG   ____________________________________________  RADIOLOGY I personally viewed and evaluated these images as part of my medical decision making, as well as reviewing the written report by the radiologist.  ED Provider Interpretation: I concur with radiologist finding of large renal cyst identified on the right side as well as  constipation.  No other acute findings  CT ABDOMEN PELVIS W CONTRAST  Result Date: 06/20/2020 CLINICAL DATA:  Right lower quadrant abdominal pain. EXAM: CT ABDOMEN AND PELVIS WITH CONTRAST TECHNIQUE: Multidetector CT imaging of the abdomen and pelvis was performed using the standard protocol following bolus administration of intravenous contrast. CONTRAST:  126mL OMNIPAQUE IOHEXOL 300 MG/ML  SOLN COMPARISON:  November 25, 2014 FINDINGS: Lower chest: No acute abnormality. Hepatobiliary: No focal liver abnormality is seen. Status post cholecystectomy.  No biliary dilatation. Pancreas: Unremarkable. No pancreatic ductal dilatation or surrounding inflammatory changes. Spleen: Normal in size without focal abnormality. Adrenals/Urinary Tract: Normal appearance of the adrenal glands. Mild left perinephric fat stranding, nonspecific. Normal appearance of the urinary bladder and ureters. Interval enlargement of the exophytic right renal cyst which now measures 13 cm from prior measurement of 8 cm (August 2016). Stable in appearance atherosclerotic calcifications of the right renal artery. Stomach/Bowel: Stomach is within normal limits. No secondary signs of appendicitis. No evidence of bowel wall thickening, distention, or inflammatory changes. Vascular/Lymphatic: No significant vascular findings are present. No enlarged abdominal or pelvic lymph nodes. Reproductive: Nonenlarged prostate. Other: No abdominal wall hernia or abnormality. No abdominopelvic ascites. Musculoskeletal: No acute or significant osseous findings. IMPRESSION: 1. No evidence of acute abnormalities within the solid abdominal organs. 2. Interval enlargement of the exophytic simple right renal cyst which now measures 13 cm from prior measurement of 8 cm (August 2016). 3. Stable in appearance atherosclerotic calcifications of the right renal artery. Electronically Signed   By: Fidela Salisbury M.D.   On: 06/20/2020 14:49     ____________________________________________    PROCEDURES  Procedure(s) performed:    Procedures    Medications  iohexol (OMNIPAQUE) 300 MG/ML solution 100 mL (100 mLs Intravenous Contrast Given 06/20/20 1408)     ____________________________________________   INITIAL IMPRESSION / ASSESSMENT AND PLAN / ED COURSE  Pertinent labs & imaging results that were available during my care of the patient were reviewed by me and considered in my medical decision making (see chart for details).  Review of the [place] CSRS was performed in accordance of the [place] prior to dispensing any controlled drugs.           Patient's diagnosis is consistent with right renal cyst, right lower quadrant pain and constipation.  Patient presented to the emergency department complaining of right lower quadrant pain.  He has had some constipation but given the chronicity of the pain that has been significantly worsening imaging was obtained.  Labs were reassuring.  Imaging revealed a large renal cyst that was enlarging compared to previous imaging.  Patient also has some constipation.  Feel that symptoms are likely secondary to both the cyst and the constipation.  Patient will be placed on Senokot for his constipation.  Meloxicam and Norco for pain relief from this large cyst.  I have referred the patient to urology for further management.  Return precautions discussed with the patient. Patient is given ED precautions to return to the ED for any worsening or new symptoms.     ____________________________________________  FINAL CLINICAL IMPRESSION(S) / ED DIAGNOSES  Final diagnoses:  Renal cyst  RLQ abdominal pain  Constipation, unspecified constipation type      NEW MEDICATIONS STARTED DURING THIS VISIT:  ED Discharge Orders         Ordered    meloxicam (MOBIC) 15 MG tablet  Daily        06/20/20 1547    HYDROcodone-acetaminophen (NORCO/VICODIN) 5-325 MG tablet  Every 4 hours PRN         06/20/20 1547    senna-docusate (SENOKOT-S) 8.6-50 MG tablet  Daily        06/20/20 1547              This chart was dictated using voice recognition software/Dragon. Despite best efforts to proofread, errors can occur which can change the meaning. Any change was purely unintentional.    Darletta Moll, PA-C 06/20/20 1549  Harvest Dark, MD 06/20/20 [phone]

## 2020-06-20 NOTE — ED Notes (Signed)
Patient transported to CT 

## 2020-06-25 ENCOUNTER — Ambulatory Visit (INDEPENDENT_AMBULATORY_CARE_PROVIDER_SITE_OTHER): Payer: Medicare Other | Admitting: Urology

## 2020-06-25 ENCOUNTER — Other Ambulatory Visit: Payer: Self-pay

## 2020-06-25 ENCOUNTER — Other Ambulatory Visit: Payer: Self-pay | Admitting: Radiology

## 2020-06-25 ENCOUNTER — Encounter: Payer: Self-pay | Admitting: Urology

## 2020-06-25 VITALS — BP 144/70 | HR 55 | Ht 68.0 in | Wt 219.0 lb

## 2020-06-25 DIAGNOSIS — N281 Cyst of kidney, acquired: Secondary | ICD-10-CM

## 2020-06-25 DIAGNOSIS — R1031 Right lower quadrant pain: Secondary | ICD-10-CM

## 2020-06-25 NOTE — Progress Notes (Signed)
06/25/2020 12:20 PM   Ernestine Mcmurray 16-Mar-1956 [id number]  Referring provider: Hortencia Pilar, MD [address], [place] [address],  [place] [address]  Chief Complaint  Patient presents with  . Abdominal Pain    HPI: 65 y.o. male referred for evaluation of a right renal cyst and abdominal pain.   Seen [facility] ED 06/20/2020 for intermittent right lower quadrant abdominal pain times several months that had acutely worsened over 2 days  History of a simple right renal cyst that was 8 cm on a CT in 2016  CT abdomen pelvis performed at his ED visit remarkable for a 13 cm simple right renal cyst  No bothersome LUTS  Complains of constipation   PMH: Past Medical History:  Diagnosis Date  . Acid reflux 12/29/2011  . Arteriosclerosis of coronary artery 12/29/2011   Overview:  Diffuse three vessel,mild  . Benign essential HTN 02/12/2014  . Breathlessness on exertion 06/09/2014  . CAFL (chronic airflow limitation) ([place]) 04/30/2013   Overview:  Spirometry 12/14 FVC 89% FEV1 79% FEV1/FVC 66% c/w moderate obstructive disease Dr. Vella Kohler   . COPD (chronic obstructive pulmonary disease) ([place])   . H/O alcohol abuse 12/29/2011  . Hernia of anterior abdominal wall 12/29/2011  . Hypertension    controlled on meds  . Kidney cysts 12/29/2011    Surgical History: Past Surgical History:  Procedure Laterality Date  . CARDIAC CATHETERIZATION    . CHOLECYSTECTOMY    . COLONOSCOPY  02/08/2013  . ESOPHAGOGASTRODUODENOSCOPY  10/03/2011  . ESOPHAGOGASTRODUODENOSCOPY (EGD) WITH PROPOFOL N/A 01/09/2015   Procedure: ESOPHAGOGASTRODUODENOSCOPY (EGD) WITH PROPOFOL;  Surgeon: Lucilla Lame, MD;  Location: [place];  Service: Endoscopy;  Laterality: N/A;  . HERNIA REPAIR    . laser benign laryngeal lesion      Home Medications:  Allergies as of 06/25/2020      Reactions   Codeine Other (See Comments)   Reaction: "messes up his intestinal tract" so he doesn't like taking it.       Medication  List       Accurate as of June 25, 2020 11:59 PM. If you have any questions, ask your nurse or doctor.        STOP taking these medications   clobetasol cream 0.05 % Commonly known as: TEMOVATE Stopped by: Abbie Sons, MD   Clocortolone Pivalate 0.1 % cream Commonly known as: CLODERM Stopped by: Abbie Sons, MD   mupirocin ointment 2 % Commonly known as: BACTROBAN Stopped by: Abbie Sons, MD   pimecrolimus 1 % cream Commonly known as: ELIDEL Stopped by: Abbie Sons, MD   Sernivo 0.05 % Emul Generic drug: Betamethasone Dipropionate Stopped by: Abbie Sons, MD   tacrolimus 0.1 % ointment Commonly known as: PROTOPIC Stopped by: Abbie Sons, MD     TAKE these medications   acetaminophen 650 MG CR tablet Commonly known as: TYLENOL Take 650 mg by mouth. One tablet every 8 to 12 hours as needed for joint pain.   Advair Diskus 100-50 MCG/DOSE Aepb Generic drug: Fluticasone-Salmeterol Inhale 1 puff into the lungs daily as needed (shortness of breath).   albuterol 108 (90 Base) MCG/ACT inhaler Commonly known as: VENTOLIN HFA Inhale 2 puffs into the lungs every 4 (four) hours as needed.   atorvastatin 40 MG tablet Commonly known as: LIPITOR Take 40 mg by mouth daily.   cetirizine 10 MG tablet Commonly known as: ZYRTEC Take 10 mg by mouth at bedtime.   dicyclomine 20  MG tablet Commonly known as: BENTYL Take 1 tablet (20 mg total) by mouth 4 (four) times daily -  before meals and at bedtime.   escitalopram 10 MG tablet Commonly known as: LEXAPRO Take 10 mg by mouth daily.   HYDROcodone-acetaminophen 5-325 MG tablet Commonly known as: NORCO/VICODIN Take 1 tablet by mouth every 4 (four) hours as needed for moderate pain.   lisinopril 20 MG tablet Commonly known as: ZESTRIL Take 20 mg by mouth 2 (two) times daily. am   loperamide 2 MG capsule Commonly known as: IMODIUM Take 2 mg by mouth as needed for diarrhea or loose stools.    Melatonin 10 MG Tabs Take 20 mg by mouth at bedtime.   meloxicam 15 MG tablet Commonly known as: MOBIC Take 1 tablet (15 mg total) by mouth daily.   metoprolol succinate 25 MG 24 hr tablet Commonly known as: TOPROL-XL Take 25 mg by mouth daily.   multivitamin with minerals Tabs tablet Take 1 tablet by mouth daily.   nitroGLYCERIN 0.4 MG SL tablet Commonly known as: NITROSTAT Place 0.4 mg under the tongue every 5 (five) minutes as needed for chest pain.   potassium chloride SA 20 MEQ tablet Commonly known as: KLOR-CON Take 20 mEq by mouth daily.   senna-docusate 8.6-50 MG tablet Commonly known as: Senokot-S Take 1 tablet by mouth daily.       Allergies:  Allergies  Allergen Reactions  . Codeine Other (See Comments)    Reaction: "messes up his intestinal tract" so he doesn't like taking it.     Family History: Family History  Problem Relation Age of Onset  . Coronary artery disease Father   . Colon polyps Father   . Gallbladder disease Father   . Coronary artery disease Mother   . Hypertension Mother   . Heart failure Mother   . Gallbladder disease Brother   . Colon polyps Brother     Social History:  reports that he has quit smoking. He has a 35.00 pack-year smoking history. He has quit using smokeless tobacco.  His smokeless tobacco use included snuff. He reports current alcohol use. He reports that he does not use drugs.   Physical Exam: BP (!) 144/70   Pulse (!) 55   Ht 5\' 8"  (1.727 m)   Wt 219 lb (99.3 kg)   BMI 33.30 kg/m   Constitutional:  Alert and oriented, No acute distress. HEENT: [place] AT, moist mucus membranes.  Trachea midline, no masses. Cardiovascular: No clubbing, cyanosis, or edema. Respiratory: Normal respiratory effort, no increased work of breathing. GI: Abdomen is soft, nontender, nondistended, no abdominal masses GU: No CVA tenderness   Pertinent Imaging: CT images were personally reviewed and interpreted   Assessment & Plan:     1.  Right renal cyst  Large right renal cyst measuring 13 cm  Patient feels his pain is secondary to the renal cyst  We discussed that cyst rarely cause pain however can be a source of pain when they are large  Recommend scheduling cyst aspiration with IR to see if this improves/resolves his pain   Abbie Sons, MD  [facility] [address] [place], [place] [address] [phone]

## 2020-06-26 ENCOUNTER — Other Ambulatory Visit: Payer: Self-pay | Admitting: Radiology

## 2020-06-26 ENCOUNTER — Other Ambulatory Visit: Payer: Self-pay | Admitting: Urology

## 2020-06-26 DIAGNOSIS — N281 Cyst of kidney, acquired: Secondary | ICD-10-CM

## 2020-06-30 ENCOUNTER — Other Ambulatory Visit: Payer: Self-pay | Admitting: Internal Medicine

## 2020-07-04 ENCOUNTER — Encounter: Payer: Self-pay | Admitting: Emergency Medicine

## 2020-07-06 ENCOUNTER — Other Ambulatory Visit: Payer: Self-pay | Admitting: Student

## 2020-07-07 ENCOUNTER — Other Ambulatory Visit: Payer: Self-pay

## 2020-07-07 ENCOUNTER — Ambulatory Visit
Admission: RE | Admit: 2020-07-07 | Discharge: 2020-07-07 | Disposition: A | Discharge status: Home / Self Care | Payer: Medicare Other | Source: Ambulatory Visit | Attending: Urology | Admitting: Urology

## 2020-07-07 DIAGNOSIS — N281 Cyst of kidney, acquired: Secondary | ICD-10-CM | POA: Diagnosis not present

## 2020-07-07 DIAGNOSIS — R109 Unspecified abdominal pain: Secondary | ICD-10-CM | POA: Diagnosis present

## 2020-07-07 LAB — CBC
HCT: 39 % (ref 39.0–52.0)
Hemoglobin: 13.1 g/dL (ref 13.0–17.0)
MCH: 30.8 pg (ref 26.0–34.0)
MCHC: 33.6 g/dL (ref 30.0–36.0)
MCV: 91.8 fL (ref 80.0–100.0)
Platelets: 144 10*3/uL — ABNORMAL LOW (ref 150–400)
RBC: 4.25 MIL/uL (ref 4.22–5.81)
RDW: 12.3 % (ref 11.5–15.5)
WBC: 6.7 10*3/uL (ref 4.0–10.5)
nRBC: 0 % (ref 0.0–0.2)

## 2020-07-07 LAB — PROTIME-INR
INR: 1.1 (ref 0.8–1.2)
Prothrombin Time: 13.4 seconds (ref 11.4–15.2)

## 2020-07-07 MED ORDER — MIDAZOLAM HCL 2 MG/2ML IJ SOLN
INTRAMUSCULAR | Status: AC | PRN
  Administered 2020-07-07: 1 mg via INTRAVENOUS

## 2020-07-07 MED ORDER — FENTANYL CITRATE (PF) 100 MCG/2ML IJ SOLN
INTRAMUSCULAR | Status: AC | PRN
  Administered 2020-07-07: 50 ug via INTRAVENOUS

## 2020-07-07 MED ORDER — FENTANYL CITRATE (PF) 100 MCG/2ML IJ SOLN
INTRAMUSCULAR | Status: AC
  Filled 2020-07-07: qty 2

## 2020-07-07 MED ORDER — SODIUM CHLORIDE 0.9 % IV SOLN: INTRAVENOUS | Status: DC

## 2020-07-07 MED ORDER — MIDAZOLAM HCL 2 MG/2ML IJ SOLN
INTRAMUSCULAR | Status: AC
  Filled 2020-07-07: qty 2

## 2020-07-07 NOTE — Progress Notes (Signed)
Called [phone], and spoke with Tammy at [facility] regarding follow up post procedure today.  Tammy states Dr. Bernardo Heater will review the procedure results today and make plan for follow up and someone from the office will call patient with urology follow up plan.

## 2020-07-07 NOTE — Procedures (Signed)
  Procedure: US guided R renal cyst aspiration 857ml pale yellow fluid EBL:   minimal Complications:  none immediate  See full dictation in [facility].  Dillard Cannon MD Main # [phone] Pager  [phone] Mobile [phone]

## 2020-07-09 LAB — CYTOLOGY - NON PAP

## 2020-07-24 ENCOUNTER — Telehealth: Payer: Self-pay

## 2020-07-24 NOTE — Telephone Encounter (Signed)
Called patient to schedule follow up, he states he would like to be seen sooner than 6-8 weeks. He states flank pain got better after aspiration but recently has return. He describes right flank pain 4-6/10, tender to touch worse with certain movements. Patient is going out of town next week but was made an appointment for the week following. Patient denies fever, chills, and no hematuria noted.

## 2020-07-24 NOTE — Telephone Encounter (Signed)
-----   Message from Abbie Sons, MD sent at 07/14/2020  5:37 PM EDT ----- Would have him follow-up in 6-8 weeks ----- Message ----- From: Royanne Foots, [place]a [place]: 07/14/2020   3:06 PM EDT To: Abbie Sons, MD  Patient had his cyst aspiration on 07-07-20, does he need a follow up scheduled with you?

## 2020-08-05 ENCOUNTER — Ambulatory Visit (INDEPENDENT_AMBULATORY_CARE_PROVIDER_SITE_OTHER): Payer: Medicare Other | Admitting: Urology

## 2020-08-05 ENCOUNTER — Encounter: Payer: Self-pay | Admitting: Urology

## 2020-08-05 ENCOUNTER — Other Ambulatory Visit: Payer: Self-pay

## 2020-08-05 VITALS — BP 142/80 | HR 57 | Ht 67.0 in | Wt 215.0 lb

## 2020-08-05 DIAGNOSIS — R1031 Right lower quadrant pain: Secondary | ICD-10-CM | POA: Diagnosis not present

## 2020-08-05 DIAGNOSIS — N281 Cyst of kidney, acquired: Secondary | ICD-10-CM

## 2020-08-05 NOTE — Progress Notes (Signed)
08/05/2020 1:45 PM   Ernestine Mcmurray 05-31-1955 [id number]  Referring provider: Hortencia Pilar, MD [address] [place] [address] [place],  [place] [address]  Chief Complaint  Patient presents with  . Other    HPI: 65 y.o. male presents for follow-up.   Initially seen 06/25/2020 with right lower quadrant abdominal pain and an enlarging simple right renal cyst up to 13 cm  Cyst aspiration was performed by IR 07/07/2020 and he had resolution of his pain  Over the past few weeks he has had some return of his pain but only mild at this point though it was increased slightly last week  Urine cytology on cyst fluid was negative   PMH: Past Medical History:  Diagnosis Date  . Acid reflux 12/29/2011  . Arteriosclerosis of coronary artery 12/29/2011   Overview:  Diffuse three vessel,mild  . Benign essential HTN 02/12/2014  . Breathlessness on exertion 06/09/2014  . CAFL (chronic airflow limitation) ([place]) 04/30/2013   Overview:  Spirometry 12/14 FVC 89% FEV1 79% FEV1/FVC 66% c/w moderate obstructive disease Dr. Vella Kohler   . COPD (chronic obstructive pulmonary disease) ([place])   . H/O alcohol abuse 12/29/2011  . Hernia of anterior abdominal wall 12/29/2011  . Hypertension    controlled on meds  . Kidney cysts 12/29/2011    Surgical History: Past Surgical History:  Procedure Laterality Date  . CARDIAC CATHETERIZATION    . CHOLECYSTECTOMY    . COLONOSCOPY  02/08/2013  . ESOPHAGOGASTRODUODENOSCOPY  10/03/2011  . ESOPHAGOGASTRODUODENOSCOPY (EGD) WITH PROPOFOL N/A 01/09/2015   Procedure: ESOPHAGOGASTRODUODENOSCOPY (EGD) WITH PROPOFOL;  Surgeon: Lucilla Lame, MD;  Location: [facility];  Service: Endoscopy;  Laterality: N/A;  . HERNIA REPAIR    . laser benign laryngeal lesion      Home Medications:  Allergies as of 08/05/2020      Reactions   Codeine Other (See Comments)   Reaction: "messes up his intestinal tract" so he doesn't like taking it.       Medication List       Accurate  as of August 05, 2020  1:45 PM. If you have any questions, ask your nurse or doctor.        acetaminophen 650 MG CR tablet Commonly known as: TYLENOL Take 650 mg by mouth. One tablet every 8 to 12 hours as needed for joint pain.   Advair Diskus 100-50 MCG/DOSE Aepb Generic drug: Fluticasone-Salmeterol Inhale 1 puff into the lungs daily as needed (shortness of breath).   albuterol 108 (90 Base) MCG/ACT inhaler Commonly known as: VENTOLIN HFA Inhale 2 puffs into the lungs every 4 (four) hours as needed.   atorvastatin 40 MG tablet Commonly known as: LIPITOR Take 40 mg by mouth daily.   cetirizine 10 MG tablet Commonly known as: ZYRTEC Take 10 mg by mouth at bedtime.   dicyclomine 20 MG tablet Commonly known as: BENTYL Take 1 tablet (20 mg total) by mouth 4 (four) times daily -  before meals and at bedtime.   escitalopram 10 MG tablet Commonly known as: LEXAPRO Take 10 mg by mouth daily.   HYDROcodone-acetaminophen 5-325 MG tablet Commonly known as: NORCO/VICODIN Take 1 tablet by mouth every 4 (four) hours as needed for moderate pain.   lisinopril 20 MG tablet Commonly known as: ZESTRIL Take 20 mg by mouth 2 (two) times daily. am   loperamide 2 MG capsule Commonly known as: IMODIUM Take 2 mg by mouth as needed for diarrhea or loose stools.   Melatonin 10 MG  Tabs Take 20 mg by mouth at bedtime.   meloxicam 15 MG tablet Commonly known as: MOBIC Take 1 tablet (15 mg total) by mouth daily.   metoprolol succinate 25 MG 24 hr tablet Commonly known as: TOPROL-XL Take 25 mg by mouth daily.   multivitamin with minerals Tabs tablet Take 1 tablet by mouth daily.   nitroGLYCERIN 0.4 MG SL tablet Commonly known as: NITROSTAT Place 0.4 mg under the tongue every 5 (five) minutes as needed for chest pain.   potassium chloride SA 20 MEQ tablet Commonly known as: KLOR-CON Take 20 mEq by mouth daily.   senna-docusate 8.6-50 MG tablet Commonly known as: Senokot-S Take 1  tablet by mouth daily.       Allergies:  Allergies  Allergen Reactions  . Codeine Other (See Comments)    Reaction: "messes up his intestinal tract" so he doesn't like taking it.     Family History: Family History  Problem Relation Age of Onset  . Coronary artery disease Father   . Colon polyps Father   . Gallbladder disease Father   . Coronary artery disease Mother   . Hypertension Mother   . Heart failure Mother   . Gallbladder disease Brother   . Colon polyps Brother     Social History:  reports that he has quit smoking. He has a 35.00 pack-year smoking history. He has quit using smokeless tobacco.  His smokeless tobacco use included snuff. He reports current alcohol use. He reports that he does not use drugs.   Physical Exam: BP (!) 142/80   Pulse (!) 57   Ht 5\' 7"  (1.702 m)   Wt 215 lb (97.5 kg)   BMI 33.67 kg/m   Constitutional:  Alert and oriented, No acute distress. HEENT: [place] AT, moist mucus membranes.  Trachea midline, no masses. Cardiovascular: No clubbing, cyanosis, or edema. Respiratory: Normal respiratory effort, no increased work of breathing.   Assessment & Plan:    65 y.o. male status post aspiration of a large right renal cyst with resolution of his abdominal pain.  Pain has increased slightly.  We will schedule a follow-up renal ultrasound to assess for fluid reaccumulation and if present and he has worsening pain consider laparoscopic cyst decortication.  He will be notified with the ultrasound results.    Abbie Sons, [facility] [address] [place], [place] [address] [phone]

## 2020-09-09 ENCOUNTER — Ambulatory Visit: Payer: Medicare Other | Admitting: Dermatology

## 2020-11-10 ENCOUNTER — Other Ambulatory Visit (HOSPITAL_COMMUNITY): Payer: Self-pay | Admitting: Family Medicine

## 2020-12-01 ENCOUNTER — Other Ambulatory Visit: Payer: Self-pay

## 2020-12-01 ENCOUNTER — Ambulatory Visit (INDEPENDENT_AMBULATORY_CARE_PROVIDER_SITE_OTHER): Payer: Medicare Other | Admitting: Dermatology

## 2020-12-01 ENCOUNTER — Telehealth: Payer: Self-pay

## 2020-12-01 DIAGNOSIS — L209 Atopic dermatitis, unspecified: Secondary | ICD-10-CM

## 2020-12-01 DIAGNOSIS — L578 Other skin changes due to chronic exposure to nonionizing radiation: Secondary | ICD-10-CM | POA: Diagnosis not present

## 2020-12-01 DIAGNOSIS — L821 Other seborrheic keratosis: Secondary | ICD-10-CM

## 2020-12-01 DIAGNOSIS — L820 Inflamed seborrheic keratosis: Secondary | ICD-10-CM | POA: Diagnosis not present

## 2020-12-01 DIAGNOSIS — D1800 Hemangioma unspecified site: Secondary | ICD-10-CM

## 2020-12-01 DIAGNOSIS — Z872 Personal history of diseases of the skin and subcutaneous tissue: Secondary | ICD-10-CM

## 2020-12-01 DIAGNOSIS — D692 Other nonthrombocytopenic purpura: Secondary | ICD-10-CM

## 2020-12-01 MED ORDER — EUCRISA 2 % EX OINT: TOPICAL_OINTMENT | CUTANEOUS | 6 refills | Status: DC

## 2020-12-01 NOTE — Patient Instructions (Signed)

## 2020-12-01 NOTE — Progress Notes (Signed)
Follow-Up Visit   Subjective  Dean Tanner is a 65 y.o. male who presents for the following: atopic dermatitis (Of the trunk and extremities - some patches on the ankles which are persistent. Patient currently using CeraVe cream daily and Dupixent '300mg'$ /32m SQ QOW. ) and skin lesions (On the L temple and R shoulder - irregular, irritated, patient would like them checked today. ).  The following portions of the chart were reviewed this encounter and updated as appropriate:   Tobacco  Allergies  Meds  Problems  Med Hx  Surg Hx  Fam Hx     Review of Systems:  No other skin or systemic complaints except as noted in HPI or Assessment and Plan.  Objective  Well appearing patient in no apparent distress; mood and affect are within normal limits.  A focused examination was performed including the face, trunk, and extremities. Relevant physical exam findings are noted in the Assessment and Plan.   Assessment & Plan  Atopic dermatitis - severe, but better controlled on Dupixent. Improved, but not to goal. Trunk and extremities  Atopic dermatitis (eczema) is a chronic, relapsing, pruritic condition that can significantly affect quality of life. It is often associated with allergic rhinitis and/or asthma and can require treatment with topical medications, phototherapy, or in severe cases a biologic medication called Dupixent in children and adults.   Continue Dupixent '300mg'$ /287mSQ QOW. Dupilumab (Dupixent) is a treatment given by injection for adults with moderate-to-severe atopic dermatitis. Goal is control of skin condition, not cure. It is given as 2 injections at the first dose followed by 1 injection ever 2 weeks thereafter.  Potential side effects include allergic reaction, herpes infections, injection site reactions and conjunctivitis (inflammation of the eyes).  The use of Dupixent requires long term medication management, including periodic office visits.  Continue CeraVe cream  daily.   Start Eucrisa 2% ointment to aa's BID PRN flares.   Crisaborole (EUCRISA) 2 % OINT - Trunk and extremities Apply to aa's eczema BID  PRN.  Inflamed seborrheic keratosis L temple x 1  Destruction of lesion - L temple x 1 Complexity: simple   Destruction method: cryotherapy   Informed consent: discussed and consent obtained   Timeout:  patient name, date of birth, surgical site, and procedure verified Lesion destroyed using liquid nitrogen: Yes   Region frozen until ice ball extended beyond lesion: Yes   Outcome: patient tolerated procedure well with no complications   Post-procedure details: wound care instructions given    History of actinic keratosis Face Clear. Observe for recurrence. Call clinic for new or changing lesions.  Recommend regular skin exams, daily broad-spectrum spf 30+ sunscreen use, and photoprotection.    Actinic Damage - chronic, secondary to cumulative UV radiation exposure/sun exposure over time - diffuse scaly erythematous macules with underlying dyspigmentation - Recommend daily broad spectrum sunscreen SPF 30+ to sun-exposed areas, reapply every 2 hours as needed.  - Recommend staying in the shade or wearing long sleeves, sun glasses (UVA+UVB protection) and wide brim hats (4-inch brim around the entire circumference of the hat). - Call for new or changing lesions.  Seborrheic Keratoses - Stuck-on, waxy, tan-brown papules and/or plaques  - Benign-appearing - Discussed benign etiology and prognosis. - Observe - Call for any changes  Hemangiomas - R shoulder  - Red papules - Discussed benign nature - Observe - Call for any changes  Purpura - Chronic; persistent and recurrent.  Treatable, but not curable. - Violaceous macules and patches -  Benign - Related to trauma, age, sun damage and/or use of blood thinners, chronic use of topical and/or oral steroids - Observe - Can use OTC arnica containing moisturizer such as Dermend Bruise  Formula if desired - Call for worsening or other concerns  Return in about 6 months (around 06/03/2021) for AK and A.D. follow up .  Luther Redo, CMA, am acting as scribe for Sarina Ser, MD .  Documentation: I have reviewed the above documentation for accuracy and completeness, and I agree with the above.  Sarina Ser, MD

## 2020-12-01 NOTE — Telephone Encounter (Signed)
Eucrisa not on pts formulary. Please advise.

## 2020-12-02 ENCOUNTER — Encounter: Payer: Self-pay | Admitting: Dermatology

## 2020-12-02 MED ORDER — TACROLIMUS 0.1 % EX OINT: TOPICAL_OINTMENT | CUTANEOUS | 6 refills | Status: DC

## 2020-12-02 NOTE — Addendum Note (Signed)
Addended by: Harriett Sine on: 12/02/2020 08:22 AM   Modules accepted: Orders

## 2021-01-15 ENCOUNTER — Other Ambulatory Visit: Payer: Self-pay | Admitting: Family Medicine

## 2021-01-15 DIAGNOSIS — Z87891 Personal history of nicotine dependence: Secondary | ICD-10-CM

## 2021-01-18 ENCOUNTER — Other Ambulatory Visit: Payer: Self-pay

## 2021-01-18 ENCOUNTER — Ambulatory Visit
Admission: RE | Admit: 2021-01-18 | Discharge: 2021-01-18 | Disposition: A | Discharge status: Home / Self Care | Payer: Medicare Other | Source: Ambulatory Visit | Attending: Family Medicine | Admitting: Family Medicine

## 2021-01-18 DIAGNOSIS — Z87891 Personal history of nicotine dependence: Secondary | ICD-10-CM | POA: Insufficient documentation

## 2021-03-16 ENCOUNTER — Other Ambulatory Visit: Payer: Self-pay

## 2021-03-16 ENCOUNTER — Ambulatory Visit: Payer: Medicare Other | Attending: Internal Medicine

## 2021-03-16 DIAGNOSIS — Z23 Encounter for immunization: Secondary | ICD-10-CM

## 2021-03-16 MED ORDER — PFIZER COVID-19 VAC BIVALENT 30 MCG/0.3ML IM SUSP
INTRAMUSCULAR | 0 refills | Status: AC
  Filled 2021-03-16: qty 0.3, 1d supply, fill #0

## 2021-03-16 NOTE — Progress Notes (Signed)
   Covid-19 Vaccination Clinic  Name:  Dean Tanner    MRN: [id number] DOB: Oct 03, 1955  03/16/2021  [name] was observed post Covid-19 immunization for 15 minutes without incident. He was provided with Vaccine Information Sheet and instruction to access the V-Safe system.   [name] was instructed to call 911 with any severe reactions post vaccine: Difficulty breathing  Swelling of face and throat  A fast heartbeat  A bad rash all over body  Dizziness and weakness   Immunizations Administered     Name Date Dose VIS Date Route   Pfizer Covid-19 Vaccine Bivalent Booster 03/16/2021 10:36 AM 0.3 mL 12/23/2020 Intramuscular   Manufacturer: [place]   Lot: [id number]   Kings: Archie, PharmD, MBA Clinical Acute Care Pharmacist

## 2021-03-30 ENCOUNTER — Other Ambulatory Visit: Payer: Self-pay

## 2021-03-30 DIAGNOSIS — L209 Atopic dermatitis, unspecified: Secondary | ICD-10-CM

## 2021-03-30 MED ORDER — DUPIXENT 300 MG/2ML ~~LOC~~ SOSY
300.0000 mg | PREFILLED_SYRINGE | SUBCUTANEOUS | 0 refills | Status: DC
Start: 2021-03-30 — End: 2021-06-01

## 2021-06-01 ENCOUNTER — Other Ambulatory Visit: Payer: Self-pay

## 2021-06-01 ENCOUNTER — Ambulatory Visit (INDEPENDENT_AMBULATORY_CARE_PROVIDER_SITE_OTHER): Payer: Medicare Other | Admitting: Dermatology

## 2021-06-01 DIAGNOSIS — L2089 Other atopic dermatitis: Secondary | ICD-10-CM

## 2021-06-01 DIAGNOSIS — L578 Other skin changes due to chronic exposure to nonionizing radiation: Secondary | ICD-10-CM | POA: Diagnosis not present

## 2021-06-01 DIAGNOSIS — D692 Other nonthrombocytopenic purpura: Secondary | ICD-10-CM | POA: Diagnosis not present

## 2021-06-01 DIAGNOSIS — L209 Atopic dermatitis, unspecified: Secondary | ICD-10-CM

## 2021-06-01 DIAGNOSIS — L821 Other seborrheic keratosis: Secondary | ICD-10-CM

## 2021-06-01 DIAGNOSIS — Z79899 Other long term (current) drug therapy: Secondary | ICD-10-CM | POA: Diagnosis not present

## 2021-06-01 MED ORDER — OPZELURA 1.5 % EX CREA: 1.0000 "application " | TOPICAL_CREAM | Freq: Every day | CUTANEOUS | 5 refills | Status: AC

## 2021-06-01 MED ORDER — DUPIXENT 300 MG/2ML ~~LOC~~ SOSY: 300.0000 mg | PREFILLED_SYRINGE | SUBCUTANEOUS | 0 refills | Status: DC

## 2021-06-01 NOTE — Patient Instructions (Signed)

## 2021-06-01 NOTE — Progress Notes (Signed)
Follow-Up Visit   Subjective  Dean Tanner is a 66 y.o. male who presents for the following: Eczema (Patient currently using Dupixent 300mg /12mL SQ Q2W and CeraVe cream moisturizer. He has had occasional flares on his back and legs.). The patient has spots, moles and lesions to be evaluated, some may be new or changing and the patient has concerns.   The following portions of the chart were reviewed this encounter and updated as appropriate:   Tobacco   Allergies   Meds   Problems   Med Hx   Surg Hx   Fam Hx      Review of Systems:  No other skin or systemic complaints except as noted in HPI or Assessment and Plan.  Objective  Well appearing patient in no apparent distress; mood and affect are within normal limits.  A focused examination was performed including the face and extremities. Relevant physical exam findings are noted in the Assessment and Plan.  Trunk, extremities Pink patch on the L ankle.    Assessment & Plan  Atopic dermatitis Chronic and persistent condition with duration or expected duration over one year. Condition is bothersome/symptomatic for patient. Currently flared.  Trunk, extremities  Atopic dermatitis (eczema) is a chronic, relapsing, pruritic condition that can significantly affect quality of life. It is often associated with allergic rhinitis and/or asthma and can require treatment with topical medications, phototherapy, or in severe cases biologic injectable medication (Dupixent; Adbry) or Oral JAK inhibitors.  Dupilumab (Dupixent) is a treatment given by injection for adults and children with moderate-to-severe atopic dermatitis. Goal is control of skin condition, not cure. It is given as 2 injections at the first dose followed by 1 injection ever 2 weeks thereafter.  Young children are dosed monthly.  Potential side effects include allergic reaction, herpes infections, injection site reactions and conjunctivitis (inflammation of the eyes).  The use of  Dupixent requires long term medication management, including periodic office visits.  Continue Dupixent 300mg /17mL SQ QOW.   Start Opzelura cream to aa's QD. Samples given.   Ruxolitinib Phosphate (OPZELURA) 1.5 % CREA - Trunk, extremities Apply 1 application topically daily. Apply to eczema QD PRN.  Related Medications tacrolimus (PROTOPIC) 0.1 % ointment Apply to aa's eczema BID PRN. dupilumab (DUPIXENT) 300 MG/2ML prefilled syringe Inject 300 mg into the skin every 14 (fourteen) days. For maintenance.  Actinic Damage - chronic, secondary to cumulative UV radiation exposure/sun exposure over time - diffuse scaly erythematous macules with underlying dyspigmentation - Recommend daily broad spectrum sunscreen SPF 30+ to sun-exposed areas, reapply every 2 hours as needed.  - Recommend staying in the shade or wearing long sleeves, sun glasses (UVA+UVB protection) and wide brim hats (4-inch brim around the entire circumference of the hat). - Call for new or changing lesions.  Seborrheic Keratoses - Stuck-on, waxy, tan-brown papules and/or plaques  - Benign-appearing - Discussed benign etiology and prognosis. - Observe - Call for any changes  Purpura - Chronic; persistent and recurrent.  Treatable, but not curable. - Violaceous macules and patches - Benign - Related to trauma, age, sun damage and/or use of blood thinners, chronic use of topical and/or oral steroids - Observe - Can use OTC arnica containing moisturizer such as Dermend Bruise Formula if desired - Call for worsening or other concerns  Return in about 6 months (around 11/29/2021) for atopic dermatitis/Dupixent f/u and TBSE.  Luther Redo, CMA, am acting as scribe for Sarina Ser, MD . Documentation: I have reviewed the above documentation  for accuracy and completeness, and I agree with the above.  Sarina Ser, MD

## 2021-06-02 ENCOUNTER — Telehealth: Payer: Self-pay

## 2021-06-02 ENCOUNTER — Encounter: Payer: Self-pay | Admitting: Dermatology

## 2021-06-02 NOTE — Telephone Encounter (Signed)
Opzelura not covered, patient is over 36 and medicare as insurance.

## 2021-06-02 NOTE — Telephone Encounter (Signed)
Patient advised of information per Dr. Kowalski. aw 

## 2021-09-21 ENCOUNTER — Other Ambulatory Visit: Payer: Self-pay

## 2021-09-21 DIAGNOSIS — L209 Atopic dermatitis, unspecified: Secondary | ICD-10-CM

## 2021-09-21 MED ORDER — DUPIXENT 300 MG/2ML ~~LOC~~ SOSY: 300.0000 mg | PREFILLED_SYRINGE | SUBCUTANEOUS | 0 refills | Status: DC

## 2021-09-21 NOTE — Progress Notes (Signed)
Dupixent RFs sent in for patient to [facility] for patient assistance. aw

## 2021-12-06 ENCOUNTER — Ambulatory Visit (INDEPENDENT_AMBULATORY_CARE_PROVIDER_SITE_OTHER): Payer: Medicare Other | Admitting: Dermatology

## 2021-12-06 DIAGNOSIS — D1800 Hemangioma unspecified site: Secondary | ICD-10-CM

## 2021-12-06 DIAGNOSIS — L578 Other skin changes due to chronic exposure to nonionizing radiation: Secondary | ICD-10-CM | POA: Diagnosis not present

## 2021-12-06 DIAGNOSIS — Z1283 Encounter for screening for malignant neoplasm of skin: Secondary | ICD-10-CM | POA: Diagnosis not present

## 2021-12-06 DIAGNOSIS — D229 Melanocytic nevi, unspecified: Secondary | ICD-10-CM

## 2021-12-06 DIAGNOSIS — L814 Other melanin hyperpigmentation: Secondary | ICD-10-CM

## 2021-12-06 DIAGNOSIS — Z79899 Other long term (current) drug therapy: Secondary | ICD-10-CM | POA: Diagnosis not present

## 2021-12-06 DIAGNOSIS — L209 Atopic dermatitis, unspecified: Secondary | ICD-10-CM | POA: Diagnosis not present

## 2021-12-06 DIAGNOSIS — D692 Other nonthrombocytopenic purpura: Secondary | ICD-10-CM

## 2021-12-06 DIAGNOSIS — H579 Unspecified disorder of eye and adnexa: Secondary | ICD-10-CM

## 2021-12-06 DIAGNOSIS — L821 Other seborrheic keratosis: Secondary | ICD-10-CM

## 2021-12-06 DIAGNOSIS — L918 Other hypertrophic disorders of the skin: Secondary | ICD-10-CM

## 2021-12-06 NOTE — Progress Notes (Unsigned)
Follow-Up Visit   Subjective  Dean Tanner is a 66 y.o. male who presents for the following: Follow-up (Patient here today for 6 month atopic dermatitis follow up. Patient currently on [place] but is 3 days late taking shot because his eyes have been bothering him for the last several weeks. Patient advises he feels like there is something in his eyes, pressure in eyes L>R and has "floaters". Would like to discuss. ). The patient presents for Upper Body Skin Exam (UBSE) for skin cancer screening and mole check.  The patient has spots, moles and lesions to be evaluated, some may be new or changing and the patient has concerns that these could be cancer.  Patient is not using any topicals other than CeraVe.   The following portions of the chart were reviewed this encounter and updated as appropriate:   Tobacco  Allergies  Meds  Problems  Med Hx  Surg Hx  Fam Hx     Review of Systems:  No other skin or systemic complaints except as noted in HPI or Assessment and Plan.  Objective  Well appearing patient in no apparent distress; mood and affect are within normal limits.  All skin waist up examined.  left thigh Pink patch at left thigh, left lower leg and ankle   Assessment & Plan  Atopic dermatitis, left thigh  Chronic and persistent condition with duration or expected duration over one year. Condition is symptomatic / bothersome to patient. Controlled well.  Patient advised what he is experiencing with his eyes (floaters; pressure) is more than likely not related to [place]. He has no evidence of conjunctivitis which is the side effect that is typical eye side effect from [place]. 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 Recommend patient be evaluated within the next 2 weeks by opthalmology.  Continue Dupixent SQ Q2 weeks  Dupilumab (Dupixent) is a treatment given by injection for adults and children  with moderate-to-severe atopic dermatitis. Goal is control of skin condition, not cure. It is given as 2 injections at the first dose followed by 1 injection ever 2 weeks thereafter.  Young children are dosed monthly.  Potential side effects include allergic reaction, herpes infections, injection site reactions and conjunctivitis (inflammation of the eyes).  The use of Dupixent requires long term medication management, including periodic office visits.  Related Medications dupilumab (DUPIXENT) 300 MG/2ML prefilled syringe Inject 300 mg into the skin every 14 (fourteen) days. For maintenance.  Purpura - Chronic; persistent and recurrent.  Treatable, but not curable. - Violaceous macules and patches - Benign - Related to trauma, age, sun damage and/or use of blood thinners, chronic use of topical and/or oral steroids - Observe - Can use OTC arnica containing moisturizer such as Dermend Bruise Formula if desired - Call for worsening or other concerns  Lentigines - Scattered tan macules - Due to sun exposure - Benign-appearing, observe - Recommend daily broad spectrum sunscreen SPF 30+ to sun-exposed areas, reapply every 2 hours as needed. - Call for any changes  Seborrheic Keratoses - Stuck-on, waxy, tan-brown papules and/or plaques  - Benign-appearing - Discussed benign etiology and prognosis. - Observe - Call for any changes  Melanocytic Nevi - Tan-brown and/or pink-flesh-colored symmetric macules and papules - Benign appearing on exam today - Observation - Call clinic for new or changing moles - Recommend daily use of broad spectrum spf 30+ sunscreen to sun-exposed areas.   Hemangiomas - Red  papules - Discussed benign nature - Observe - Call for any changes  Actinic Damage - Chronic condition, secondary to cumulative UV/sun exposure - diffuse scaly erythematous macules with underlying dyspigmentation - Recommend daily broad spectrum sunscreen SPF 30+ to sun-exposed  areas, reapply every 2 hours as needed.  - Staying in the shade or wearing long sleeves, sun glasses (UVA+UVB protection) and wide brim hats (4-inch brim around the entire circumference of the hat) are also recommended for sun protection.  - Call for new or changing lesions.  Skin cancer screening performed today.  Acrochordons (Skin Tags) - Fleshy, skin-colored pedunculated papules - Benign appearing.  - Observe. - If desired, they can be removed with an in office procedure that is not covered by insurance. - Please call the clinic if you notice any new or changing lesions.  Return in about 6 months (around 06/08/2022) for Dupixent, Dermatitis.  Graciella Belton, RMA, am acting as scribe for Sarina Ser, MD . Documentation: I have reviewed the above documentation for accuracy and completeness, and I agree with the above.  Sarina Ser, MD

## 2021-12-06 NOTE — Patient Instructions (Signed)
Dupilumab (Dupixent) is a treatment given by injection for adults and children with moderate-to-severe atopic dermatitis. Goal is control of skin condition, not cure. It is given as 2 injections at the first dose followed by 1 injection ever 2 weeks thereafter.  Young children are dosed monthly.  Potential side effects include allergic reaction, herpes infections, injection site reactions and conjunctivitis (inflammation of the eyes).  The use of Dupixent requires long term medication management, including periodic office visits.    Due to recent changes in healthcare laws, you may see results of your pathology and/or laboratory studies on MyChart before the doctors have had a chance to review them. We understand that in some cases there may be results that are confusing or concerning to you. Please understand that not all results are received at the same time and often the doctors may need to interpret multiple results in order to provide you with the best plan of care or course of treatment. Therefore, we ask that you please give us 2 business days to thoroughly review all your results before contacting the office for clarification. Should we see a critical lab result, you will be contacted sooner.   If You Need Anything After Your Visit  If you have any questions or concerns for your doctor, please call our main line at [phone] and press option 4 to reach your doctor's medical assistant. If no one answers, please leave a voicemail as directed and we will return your call as soon as possible. Messages left after 4 pm will be answered the following business day.   You may also send us a message via MyChart. We typically respond to MyChart messages within 1-2 business days.  For prescription refills, please ask your pharmacy to contact our office. Our fax number is [phone].  If you have an urgent issue when the clinic is closed that cannot wait until the next business day, you can page  your doctor at the number below.    Please note that while we do our best to be available for urgent issues outside of office hours, we are not available 24/7.   If you have an urgent issue and are unable to reach us, you may choose to seek medical care at your doctor's office, retail clinic, urgent care center, or emergency room.  If you have a medical emergency, please immediately call 911 or go to the emergency department.  Pager Numbers  - Dr. Kowalski: [phone]  - Dr. Moye: [phone]  - Dr. Stewart: [phone]  In the event of inclement weather, please call our main line at [phone] for an update on the status of any delays or closures.  Dermatology Medication Tips: Please keep the boxes that topical medications come in in order to help keep track of the instructions about where and how to use these. Pharmacies typically print the medication instructions only on the boxes and not directly on the medication tubes.   If your medication is too expensive, please contact our office at [phone] option 4 or send us a message through MyChart.   We are unable to tell what your co-pay for medications will be in advance as this is different depending on your insurance coverage. However, we may be able to find a substitute medication at lower cost or fill out paperwork to get insurance to cover a needed medication.   If a prior authorization is required to get your medication covered by your insurance company, please allow us 1-2 business days to   complete this process.  Drug prices often vary depending on where the prescription is filled and some pharmacies may offer cheaper prices.  The website www.goodrx.com contains coupons for medications through different pharmacies. The prices here do not account for what the cost may be with help from insurance (it may be cheaper with your insurance), but the website can give you the price if you did not use any insurance.  - You can  print the associated coupon and take it with your prescription to the pharmacy.  - You may also stop by our office during regular business hours and pick up a GoodRx coupon card.  - If you need your prescription sent electronically to a different pharmacy, notify our office through [facility] MyChart or by phone at [phone] option 4.     Si Usted Necesita Algo Despus de Su Visita  Tambin puede enviarnos un mensaje a travs de MyChart. Por lo general respondemos a los mensajes de MyChart en el transcurso de 1 a 2 das hbiles.  Para renovar recetas, por favor pida a su farmacia que se ponga en contacto con nuestra oficina. Nuestro nmero de fax es el [phone].  Si tiene un asunto urgente cuando la clnica est cerrada y que no puede esperar hasta el siguiente da hbil, puede llamar/localizar a su doctor(a) al nmero que aparece a continuacin.   Por favor, tenga en cuenta que aunque hacemos todo lo posible para estar disponibles para asuntos urgentes fuera del horario de oficina, no estamos disponibles las 24 horas del da, los 7 das de la semana.   Si tiene un problema urgente y no puede comunicarse con nosotros, puede optar por buscar atencin mdica  en el consultorio de su doctor(a), en una clnica privada, en un centro de atencin urgente o en una sala de emergencias.  Si tiene una emergencia mdica, por favor llame inmediatamente al 911 o vaya a la sala de emergencias.  Nmeros de bper  - Dr. Kowalski: [phone]  - Dra. Moye: [phone]  - Dra. Stewart: [phone]  En caso de inclemencias del tiempo, por favor llame a nuestra lnea principal al [phone] para una actualizacin sobre el estado de cualquier retraso o cierre.  Consejos para la medicacin en dermatologa: Por favor, guarde las cajas en las que vienen los medicamentos de uso tpico para ayudarle a seguir las instrucciones sobre dnde y cmo usarlos. Las farmacias generalmente imprimen las  instrucciones del medicamento slo en las cajas y no directamente en los tubos del medicamento.   Si su medicamento es muy caro, por favor, pngase en contacto con nuestra oficina llamando al [phone] y presione la opcin 4 o envenos un mensaje a travs de MyChart.   No podemos decirle cul ser su copago por los medicamentos por adelantado ya que esto es diferente dependiendo de la cobertura de su seguro. Sin embargo, es posible que podamos encontrar un medicamento sustituto a menor costo o llenar un formulario para que el seguro cubra el medicamento que se considera necesario.   Si se requiere una autorizacin previa para que su compaa de seguros cubra su medicamento, por favor permtanos de 1 a 2 das hbiles para completar este proceso.  Los precios de los medicamentos varan con frecuencia dependiendo del lugar de dnde se surte la receta y alguna farmacias pueden ofrecer precios ms baratos.  El sitio web www.goodrx.com tiene cupones para medicamentos de diferentes farmacias. Los precios aqu no tienen en cuenta lo que podra costar con la ayuda del   seguro (puede ser ms barato con su seguro), pero el sitio web puede darle el precio si no utiliz ningn seguro.  - Puede imprimir el cupn correspondiente y llevarlo con su receta a la farmacia.  - Tambin puede pasar por nuestra oficina durante el horario de atencin regular y recoger una tarjeta de cupones de GoodRx.  - Si necesita que su receta se enve electrnicamente a una farmacia diferente, informe a nuestra oficina a travs de MyChart de [place] o por telfono llamando al [phone] y presione la opcin 4.  

## 2021-12-07 ENCOUNTER — Encounter: Payer: Self-pay | Admitting: Dermatology

## 2021-12-20 ENCOUNTER — Other Ambulatory Visit: Payer: Self-pay

## 2021-12-20 DIAGNOSIS — L209 Atopic dermatitis, unspecified: Secondary | ICD-10-CM

## 2021-12-20 MED ORDER — DUPIXENT 300 MG/2ML ~~LOC~~ SOSY: 300.0000 mg | PREFILLED_SYRINGE | SUBCUTANEOUS | 0 refills | Status: DC

## 2021-12-20 NOTE — Progress Notes (Signed)
Refill request faxed from [facility]. [facility]

## 2022-03-09 ENCOUNTER — Other Ambulatory Visit: Payer: Self-pay

## 2022-03-09 DIAGNOSIS — L209 Atopic dermatitis, unspecified: Secondary | ICD-10-CM

## 2022-03-09 MED ORDER — DUPIXENT 300 MG/2ML ~~LOC~~ SOSY: 300.0000 mg | PREFILLED_SYRINGE | SUBCUTANEOUS | 0 refills | Status: DC

## 2022-03-09 NOTE — Progress Notes (Signed)
Refill request faxed over from [facility]

## 2022-06-08 ENCOUNTER — Other Ambulatory Visit: Payer: Self-pay

## 2022-06-08 ENCOUNTER — Ambulatory Visit: Payer: Medicare Other | Admitting: Dermatology

## 2022-06-08 DIAGNOSIS — L209 Atopic dermatitis, unspecified: Secondary | ICD-10-CM

## 2022-06-08 MED ORDER — DUPIXENT 300 MG/2ML ~~LOC~~ SOSY: 300.0000 mg | PREFILLED_SYRINGE | SUBCUTANEOUS | 0 refills | Status: DC

## 2022-06-08 NOTE — Progress Notes (Signed)
1 RF of Dupixent until follow up. [employer] (pt assistance) ships in 18 supplies.

## 2022-06-22 ENCOUNTER — Ambulatory Visit (INDEPENDENT_AMBULATORY_CARE_PROVIDER_SITE_OTHER): Payer: Medicare Other | Admitting: Dermatology

## 2022-06-22 VITALS — BP 135/76 | HR 51

## 2022-06-22 DIAGNOSIS — L2081 Atopic neurodermatitis: Secondary | ICD-10-CM | POA: Diagnosis not present

## 2022-06-22 DIAGNOSIS — D692 Other nonthrombocytopenic purpura: Secondary | ICD-10-CM

## 2022-06-22 DIAGNOSIS — Z1283 Encounter for screening for malignant neoplasm of skin: Secondary | ICD-10-CM

## 2022-06-22 DIAGNOSIS — Z79899 Other long term (current) drug therapy: Secondary | ICD-10-CM

## 2022-06-22 DIAGNOSIS — L590 Erythema ab igne [dermatitis ab igne]: Secondary | ICD-10-CM

## 2022-06-22 DIAGNOSIS — D229 Melanocytic nevi, unspecified: Secondary | ICD-10-CM

## 2022-06-22 DIAGNOSIS — L821 Other seborrheic keratosis: Secondary | ICD-10-CM

## 2022-06-22 DIAGNOSIS — L578 Other skin changes due to chronic exposure to nonionizing radiation: Secondary | ICD-10-CM

## 2022-06-22 DIAGNOSIS — L814 Other melanin hyperpigmentation: Secondary | ICD-10-CM

## 2022-06-22 MED ORDER — EUCRISA 2 % EX OINT: 1.0000 | TOPICAL_OINTMENT | CUTANEOUS | 6 refills | Status: DC

## 2022-06-22 NOTE — Progress Notes (Signed)
Follow-Up Visit   Subjective  Dean Tanner is a 67 y.o. male who presents for the following: Eczema (Body, 61mf/u, Dupixent '300mg'$ /220msq injections q 2 wks, well controlled, has been on >3 yrs, has one spot on R ankle ). The patient presents for Upper Body Skin Exam (UBSE) for skin cancer screening and mole check.  The patient has spots, moles and lesions to be evaluated, some may be new or changing and the patient has concerns that these could be cancer.   The following portions of the chart were reviewed this encounter and updated as appropriate:   Tobacco  Allergies  Meds  Problems  Med Hx  Surg Hx  Fam Hx     Review of Systems:  No other skin or systemic complaints except as noted in HPI or Assessment and Plan.  Objective  Well appearing patient in no apparent distress; mood and affect are within normal limits.  All skin waist up examined.  trunk, extremities R ankle with Scaly erythematous patches   low back Some mild erythema and vague modeling   Assessment & Plan   Lentigines - Scattered tan macules - Due to sun exposure - Benign-appearing, observe - Recommend daily broad spectrum sunscreen SPF 30+ to sun-exposed areas, reapply every 2 hours as needed. - Call for any changes  Seborrheic Keratoses - Stuck-on, waxy, tan-brown papules and/or plaques  - Benign-appearing - Discussed benign etiology and prognosis. - Observe - Call for any changes  Melanocytic Nevi - Tan-brown and/or pink-flesh-colored symmetric macules and papules - Benign appearing on exam today - Observation - Call clinic for new or changing moles - Recommend daily use of broad spectrum spf 30+ sunscreen to sun-exposed areas.   Hemangiomas - Red papules - Discussed benign nature - Observe - Call for any changes  Actinic Damage - Chronic condition, secondary to cumulative UV/sun exposure - diffuse scaly erythematous macules with underlying dyspigmentation - Recommend daily broad  spectrum sunscreen SPF 30+ to sun-exposed areas, reapply every 2 hours as needed.  - Staying in the shade or wearing long sleeves, sun glasses (UVA+UVB protection) and wide brim hats (4-inch brim around the entire circumference of the hat) are also recommended for sun protection.  - Call for new or changing lesions.  Skin cancer screening performed today.   Atopic neurodermatitis trunk, extremities Atopic dermatitis - Severe, on Dupixent (biologic medication).  Atopic dermatitis (eczema) is a chronic, relapsing, pruritic condition that can significantly affect quality of life. It is often associated with allergic rhinitis and/or asthma and can require treatment with topical medications, phototherapy, or in severe cases biologic medications, which require long term medication management.    Pt has been on Dupixent > 3 yrs  Cont Dupixent '300mg'$ /9m61mq injections q 2 wks Start Eucrisa oint qd/bid to aa eczema on body prn flares  Dupilumab (Dupixent) is a treatment given by injection for adults and children with moderate-to-severe atopic dermatitis. Goal is control of skin condition, not cure. It is given as 2 injections at the first dose followed by 1 injection ever 2 weeks thereafter.  Young children are dosed monthly.  Potential side effects include allergic reaction, herpes infections, injection site reactions and conjunctivitis (inflammation of the eyes).  The use of Dupixent requires long term medication management, including periodic office visits.   Crisaborole (EUCRISA) 2 % OINT - trunk, extremities Apply 1 Application topically as directed. Qd to bid to aa eczema on body until clear then prn flares  Erythema ab  igne low back Pt currently using heating pad for back pain. Discussed decreased heat and decreased time he is using heating pad  Purpura - Chronic; persistent and recurrent.  Treatable, but not curable. - Violaceous macules and patches - Benign - Related to trauma, age, sun  damage and/or use of blood thinners, chronic use of topical and/or oral steroids - Observe - Can use OTC arnica containing moisturizer such as Dermend Bruise Formula if desired - Call for worsening or other concerns  - arms  Return in about 6 months (around 12/21/2022) for Atopic Derm.  I, Othelia Pulling, RMA, am acting as scribe for Sarina Ser, MD . Documentation: I have reviewed the above documentation for accuracy and completeness, and I agree with the above.  Sarina Ser, MD

## 2022-06-22 NOTE — Patient Instructions (Signed)
Due to recent changes in healthcare laws, you may see results of your pathology and/or laboratory studies on MyChart before the doctors have had a chance to review them. We understand that in some cases there may be results that are confusing or concerning to you. Please understand that not all results are received at the same time and often the doctors may need to interpret multiple results in order to provide you with the best plan of care or course of treatment. Therefore, we ask that you please give us 2 business days to thoroughly review all your results before contacting the office for clarification. Should we see a critical lab result, you will be contacted sooner.   If You Need Anything After Your Visit  If you have any questions or concerns for your doctor, please call our main line at [phone] and press option 4 to reach your doctor's medical assistant. If no one answers, please leave a voicemail as directed and we will return your call as soon as possible. Messages left after 4 pm will be answered the following business day.   You may also send us a message via MyChart. We typically respond to MyChart messages within 1-2 business days.  For prescription refills, please ask your pharmacy to contact our office. Our fax number is [phone].  If you have an urgent issue when the clinic is closed that cannot wait until the next business day, you can page your doctor at the number below.    Please note that while we do our best to be available for urgent issues outside of office hours, we are not available 24/7.   If you have an urgent issue and are unable to reach us, you may choose to seek medical care at your doctor's office, retail clinic, urgent care center, or emergency room.  If you have a medical emergency, please immediately call 911 or go to the emergency department.  Pager Numbers  - Dr. Kowalski: [phone]  - Dr. Moye: [phone]  - Dr. Stewart:  [phone]  In the event of inclement weather, please call our main line at [phone] for an update on the status of any delays or closures.  Dermatology Medication Tips: Please keep the boxes that topical medications come in in order to help keep track of the instructions about where and how to use these. Pharmacies typically print the medication instructions only on the boxes and not directly on the medication tubes.   If your medication is too expensive, please contact our office at [phone] option 4 or send us a message through MyChart.   We are unable to tell what your co-pay for medications will be in advance as this is different depending on your insurance coverage. However, we may be able to find a substitute medication at lower cost or fill out paperwork to get insurance to cover a needed medication.   If a prior authorization is required to get your medication covered by your insurance company, please allow us 1-2 business days to complete this process.  Drug prices often vary depending on where the prescription is filled and some pharmacies may offer cheaper prices.  The website www.goodrx.com contains coupons for medications through different pharmacies. The prices here do not account for what the cost may be with help from insurance (it may be cheaper with your insurance), but the website can give you the price if you did not use any insurance.  - You can print the associated coupon and take it with   your prescription to the pharmacy.  - You may also stop by our office during regular business hours and pick up a GoodRx coupon card.  - If you need your prescription sent electronically to a different pharmacy, notify our office through [place] MyChart or by phone at [phone] option 4.     Si Usted Necesita Algo Despus de Su Visita  Tambin puede enviarnos un mensaje a travs de MyChart. Por lo general respondemos a los mensajes de MyChart en el transcurso de 1 a 2  das hbiles.  Para renovar recetas, por favor pida a su farmacia que se ponga en contacto con nuestra oficina. Nuestro nmero de fax es el [phone].  Si tiene un asunto urgente cuando la clnica est cerrada y que no puede esperar hasta el siguiente da hbil, puede llamar/localizar a su doctor(a) al nmero que aparece a continuacin.   Por favor, tenga en cuenta que aunque hacemos todo lo posible para estar disponibles para asuntos urgentes fuera del horario de oficina, no estamos disponibles las 24 horas del da, los 7 das de la semana.   Si tiene un problema urgente y no puede comunicarse con nosotros, puede optar por buscar atencin mdica  en el consultorio de su doctor(a), en una clnica privada, en un centro de atencin urgente o en una sala de emergencias.  Si tiene una emergencia mdica, por favor llame inmediatamente al 911 o vaya a la sala de emergencias.  Nmeros de bper  - Dr. Kowalski: [phone]  - Dra. Moye: [phone]  - Dra. Stewart: [phone]  En caso de inclemencias del tiempo, por favor llame a nuestra lnea principal al [phone] para una actualizacin sobre el estado de cualquier retraso o cierre.  Consejos para la medicacin en dermatologa: Por favor, guarde las cajas en las que vienen los medicamentos de uso tpico para ayudarle a seguir las instrucciones sobre dnde y cmo usarlos. Las farmacias generalmente imprimen las instrucciones del medicamento slo en las cajas y no directamente en los tubos del medicamento.   Si su medicamento es muy caro, por favor, pngase en contacto con nuestra oficina llamando al [phone] y presione la opcin 4 o envenos un mensaje a travs de MyChart.   No podemos decirle cul ser su copago por los medicamentos por adelantado ya que esto es diferente dependiendo de la cobertura de su seguro. Sin embargo, es posible que podamos encontrar un medicamento sustituto a menor costo o llenar un formulario para que el  seguro cubra el medicamento que se considera necesario.   Si se requiere una autorizacin previa para que su compaa de seguros cubra su medicamento, por favor permtanos de 1 a 2 das hbiles para completar este proceso.  Los precios de los medicamentos varan con frecuencia dependiendo del lugar de dnde se surte la receta y alguna farmacias pueden ofrecer precios ms baratos.  El sitio web www.goodrx.com tiene cupones para medicamentos de diferentes farmacias. Los precios aqu no tienen en cuenta lo que podra costar con la ayuda del seguro (puede ser ms barato con su seguro), pero el sitio web puede darle el precio si no utiliz ningn seguro.  - Puede imprimir el cupn correspondiente y llevarlo con su receta a la farmacia.  - Tambin puede pasar por nuestra oficina durante el horario de atencin regular y recoger una tarjeta de cupones de GoodRx.  - Si necesita que su receta se enve electrnicamente a una farmacia diferente, informe a nuestra oficina a travs de MyChart de [place]   o por telfono llamando al [phone] y presione la opcin 4.  

## 2022-06-29 ENCOUNTER — Encounter: Payer: Self-pay | Admitting: Dermatology

## 2022-08-26 ENCOUNTER — Other Ambulatory Visit: Payer: Self-pay | Admitting: Family Medicine

## 2022-08-26 DIAGNOSIS — Z87891 Personal history of nicotine dependence: Secondary | ICD-10-CM

## 2022-08-26 DIAGNOSIS — I10 Essential (primary) hypertension: Secondary | ICD-10-CM

## 2022-09-02 ENCOUNTER — Ambulatory Visit
Admission: RE | Admit: 2022-09-02 | Discharge: 2022-09-02 | Disposition: A | Discharge status: Home / Self Care | Payer: Medicare Other | Source: Ambulatory Visit | Attending: Family Medicine | Admitting: Family Medicine

## 2022-09-02 DIAGNOSIS — I10 Essential (primary) hypertension: Secondary | ICD-10-CM | POA: Insufficient documentation

## 2022-09-02 DIAGNOSIS — Z87891 Personal history of nicotine dependence: Secondary | ICD-10-CM | POA: Diagnosis present

## 2022-09-14 ENCOUNTER — Other Ambulatory Visit: Payer: Self-pay

## 2022-09-14 DIAGNOSIS — L209 Atopic dermatitis, unspecified: Secondary | ICD-10-CM

## 2022-09-14 MED ORDER — DUPIXENT 300 MG/2ML ~~LOC~~ SOSY
300.0000 mg | PREFILLED_SYRINGE | SUBCUTANEOUS | 0 refills | Status: DC
Start: 2022-09-14 — End: 2022-12-12

## 2022-09-14 NOTE — Progress Notes (Signed)
Dupixent RF request. aw

## 2022-12-12 ENCOUNTER — Other Ambulatory Visit: Payer: Self-pay | Admitting: Dermatology

## 2022-12-12 DIAGNOSIS — L209 Atopic dermatitis, unspecified: Secondary | ICD-10-CM

## 2022-12-19 IMAGING — CT CT CHEST LUNG CANCER SCREENING LOW DOSE W/O CM
2 of 5 series · 15 of 40 positions shown, 18 images · non-contrast
Comparison: None.

CLINICAL DATA: Former smoker with 25 pack-year history

EXAM:
CT CHEST WITHOUT CONTRAST LOW-DOSE FOR LUNG CANCER SCREENING
TECHNIQUE: Multidetector CT imaging of the chest was performed following the
standard protocol without IV contrast.

[Series 3: lung 1.00 · axial · 0.74mm/px · z∈[-1183,-877]mm · 12 of 338 slices shown, 15 images]
[im 16/338  mediastinal]
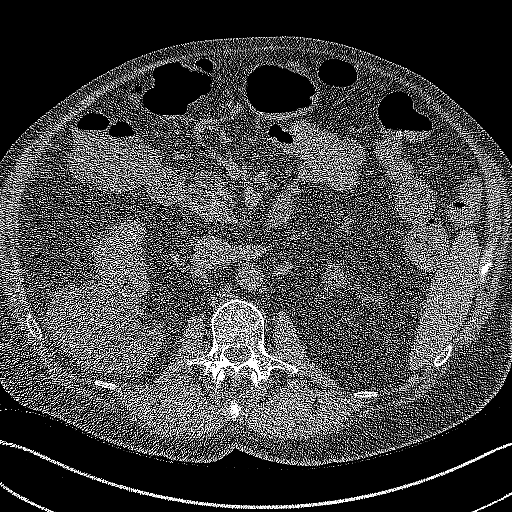
[im 16/338  lung]
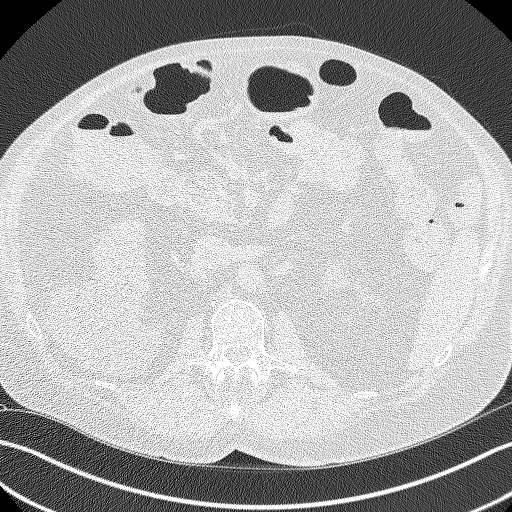
[im 46/338  lung]
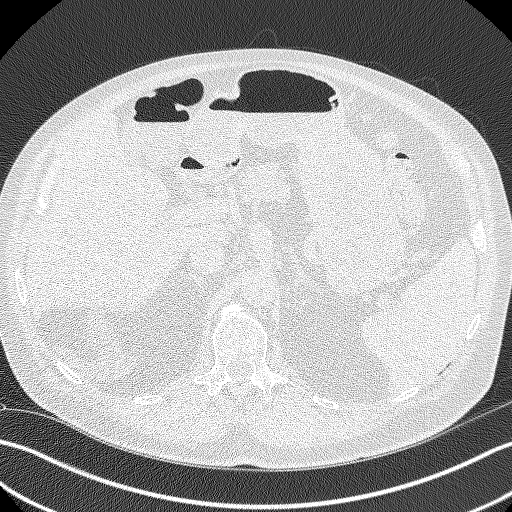
[im 77/338  lung]
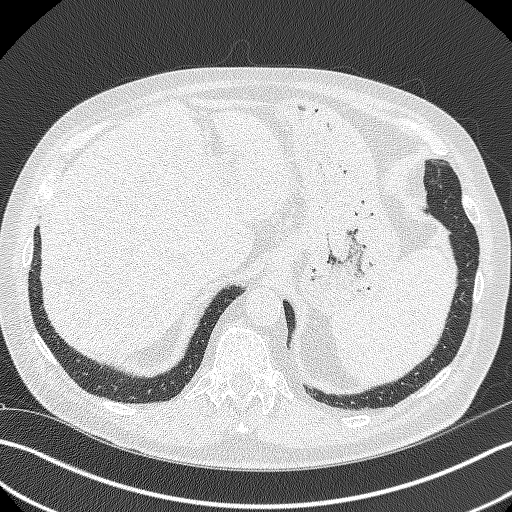
[im 108/338  lung]
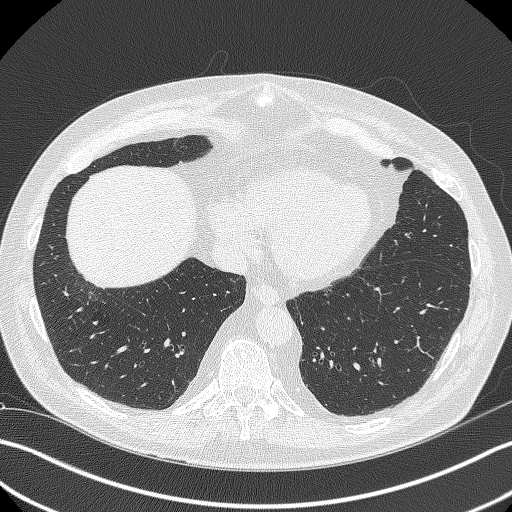
[im 123/338  mediastinal]
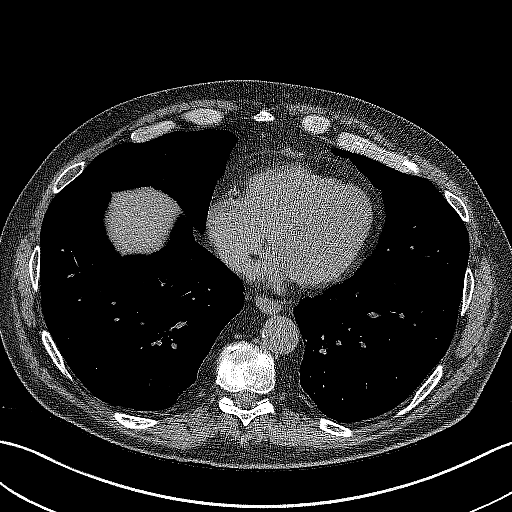
[im 123/338  lung]
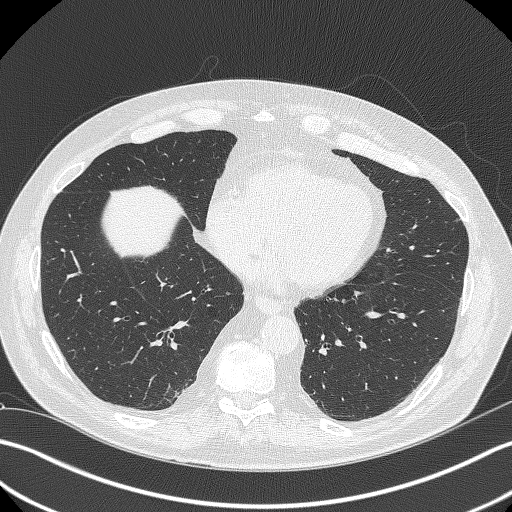
[im 154/338  lung]
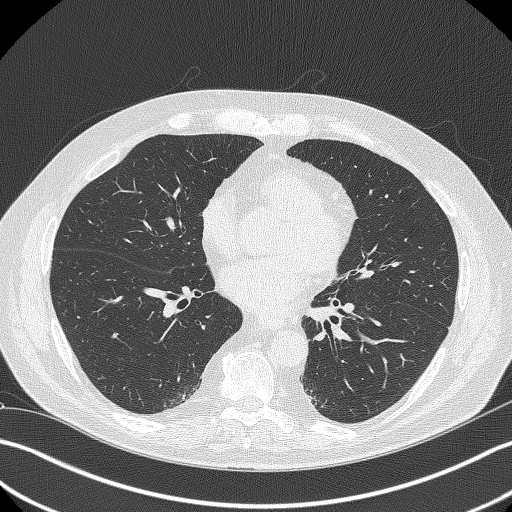
[im 184/338  lung]
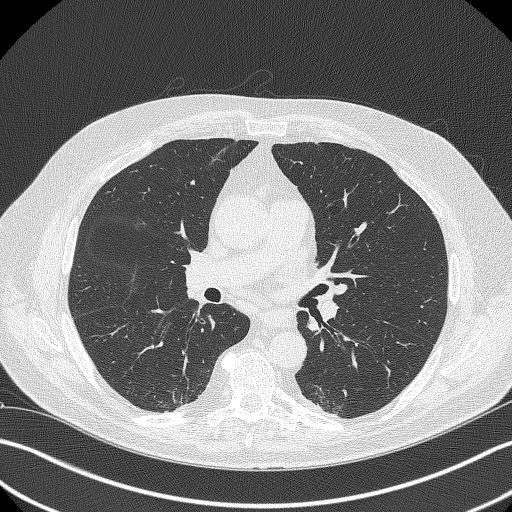
[im 215/338  lung]
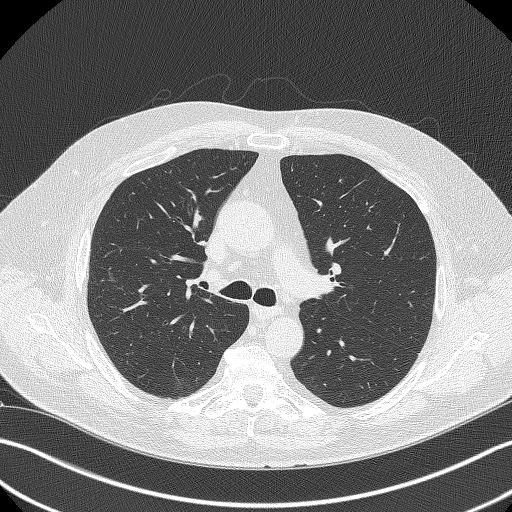
[im 230/338  mediastinal]
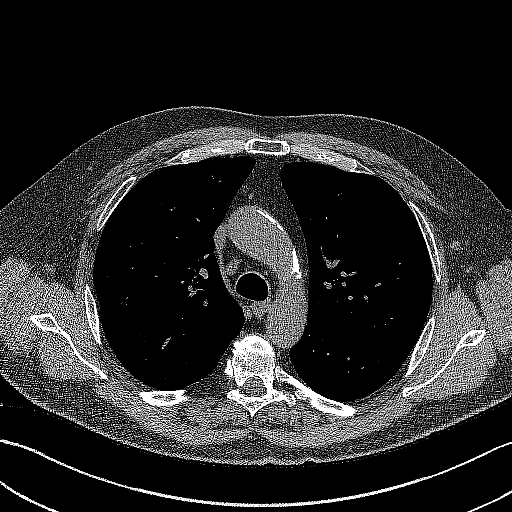
[im 230/338  lung]
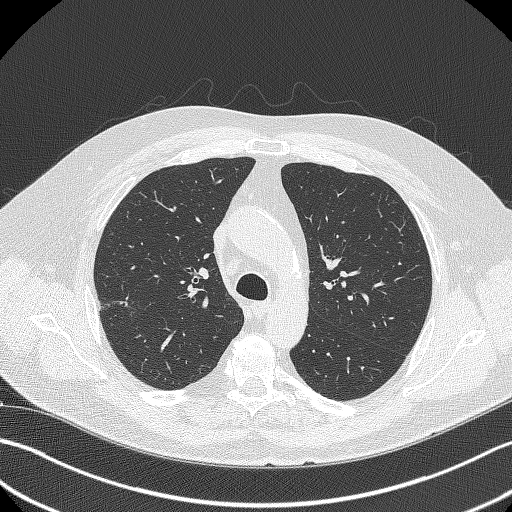
[im 261/338  lung]
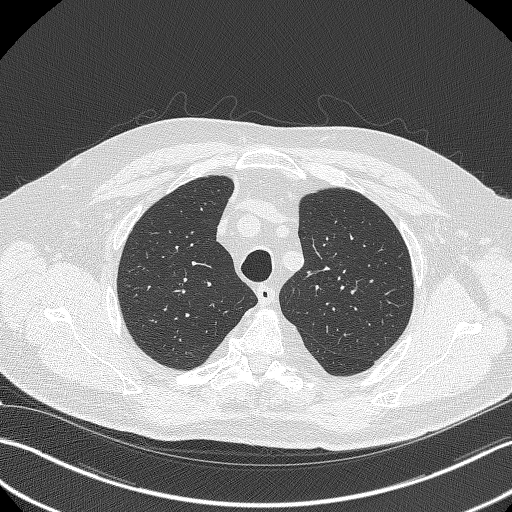
[im 292/338  lung]
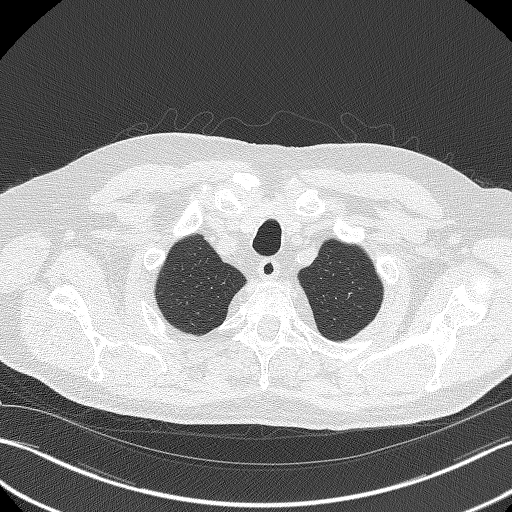
[im 322/338  lung]
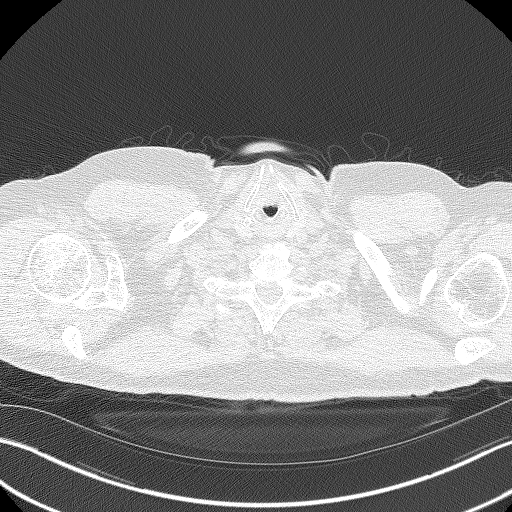

[Series 5: coronals lung 1.00 cor · coronal · 0.66mm/px · 3 of 310 slices shown]
[im 62/310  lung]
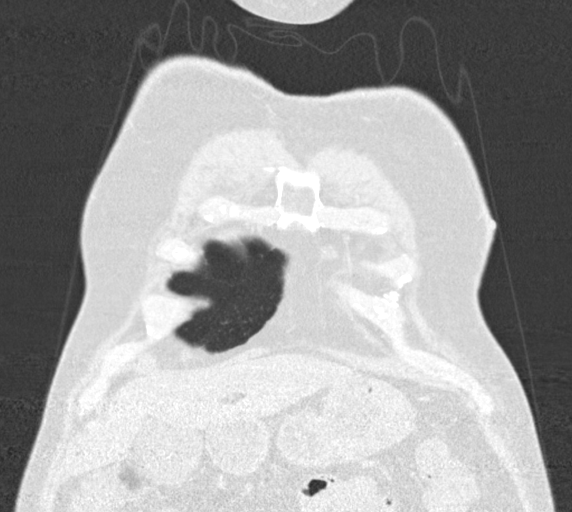
[im 124/310  lung]
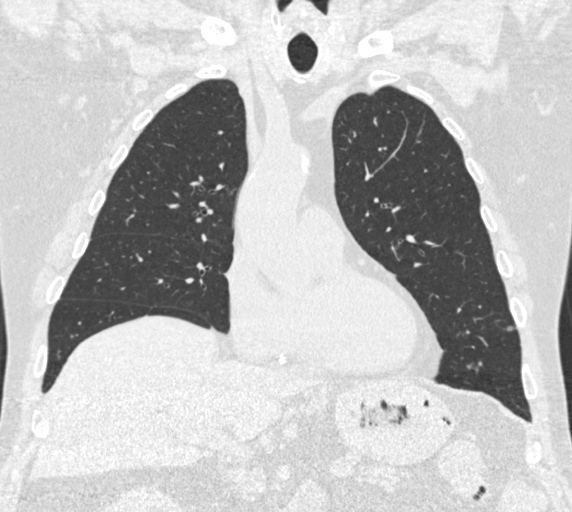
[im 186/310  lung]
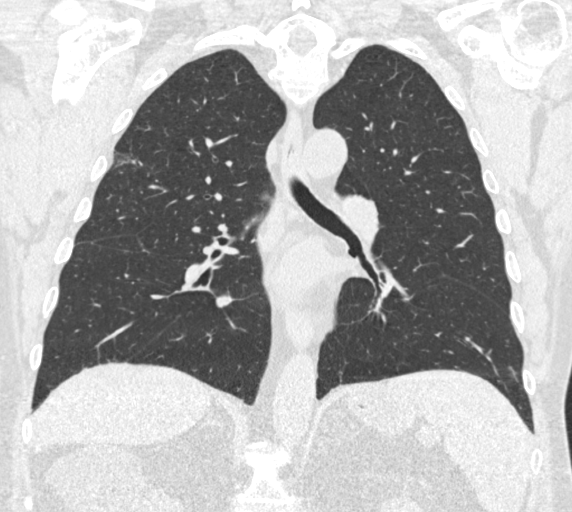

[15 of 40 positions shown; findings below may reference images not displayed]

FINDINGS: Cardiovascular: Normal heart size. No pericardial effusion.
Three-vessel coronary artery calcifications. Atherosclerotic disease
of the thoracic aorta.

Mediastinum/Nodes: Esophagus and thyroid are unremarkable. No
pathologically enlarged lymph nodes seen in the chest.

Lungs/Pleura: Central airways are patent. No consolidation, pleural
effusion or pneumothorax. Scattered small follow-up pulmonary
nodules. Reference solid pulmonary nodule of the right lower lobe
measuring 3 mm on series 3, image 254.

Upper Abdomen: Partially visualized large exophytic simple cysts the
right kidney. No acute abnormality

Musculoskeletal: No chest wall mass or suspicious bone lesions
identified.
IMPRESSION: Lung-RADS 2S, benign appearance or behavior. Continue annual
screening with low-dose chest CT without contrast in 12 months. S
modifier for coronary artery calcifications.

Three-vessel coronary artery calcifications.

Aortic Atherosclerosis ([id number]).

## 2022-12-28 ENCOUNTER — Encounter: Payer: Self-pay | Admitting: Dermatology

## 2022-12-28 ENCOUNTER — Ambulatory Visit (INDEPENDENT_AMBULATORY_CARE_PROVIDER_SITE_OTHER): Payer: Medicare Other | Admitting: Dermatology

## 2022-12-28 DIAGNOSIS — Z1283 Encounter for screening for malignant neoplasm of skin: Secondary | ICD-10-CM

## 2022-12-28 DIAGNOSIS — L578 Other skin changes due to chronic exposure to nonionizing radiation: Secondary | ICD-10-CM | POA: Diagnosis not present

## 2022-12-28 DIAGNOSIS — Z7189 Other specified counseling: Secondary | ICD-10-CM

## 2022-12-28 DIAGNOSIS — L2081 Atopic neurodermatitis: Secondary | ICD-10-CM

## 2022-12-28 DIAGNOSIS — L209 Atopic dermatitis, unspecified: Secondary | ICD-10-CM | POA: Diagnosis not present

## 2022-12-28 DIAGNOSIS — Z79899 Other long term (current) drug therapy: Secondary | ICD-10-CM

## 2022-12-28 DIAGNOSIS — D692 Other nonthrombocytopenic purpura: Secondary | ICD-10-CM | POA: Diagnosis not present

## 2022-12-28 MED ORDER — DUPIXENT 300 MG/2ML ~~LOC~~ SOAJ: 300.0000 mg | SUBCUTANEOUS | 1 refills | Status: DC

## 2022-12-28 MED ORDER — EUCRISA 2 % EX OINT
1.0000 | TOPICAL_OINTMENT | CUTANEOUS | 6 refills | Status: AC
Start: 2022-12-28 — End: ?

## 2022-12-28 NOTE — Progress Notes (Signed)
Follow-Up Visit   Subjective  Dean Tanner is a 67 y.o. male who presents for the following: Atopic Dermatitis 7m f/u, Dupixent 300mg /87ml sq injections q 2 wks, well controlled, has been on >3 yrs.    The patient presents for Upper Body Skin Exam (UBSE) for skin cancer screening and mole check.  The patient has spots, moles and lesions to be evaluated, some may be new or changing and the patient has concerns that these could be cancer.    The following portions of the chart were reviewed this encounter and updated as appropriate: medications, allergies, medical history  Review of Systems:  No other skin or systemic complaints except as noted in HPI or Assessment and Plan.  Objective  Well appearing patient in no apparent distress; mood and affect are within normal limits.  Areas Examined:face,arms,chest,back,scalp.  Upper body skin exam performed.  Relevant physical exam findings are noted in the Assessment and Plan.   Assessment & Plan   LENTIGINES, SEBORRHEIC KERATOSES, HEMANGIOMAS - Benign normal skin lesions - Benign-appearing - Call for any changes  MELANOCYTIC NEVI - Tan-brown and/or pink-flesh-colored symmetric macules and papules - Benign appearing on exam today - Observation - Call clinic for new or changing moles - Recommend daily use of broad spectrum spf 30+ sunscreen to sun-exposed areas.   ACTINIC DAMAGE - Chronic condition, secondary to cumulative UV/sun exposure - diffuse scaly erythematous macules with underlying dyspigmentation - Recommend daily broad spectrum sunscreen SPF 30+ to sun-exposed areas, reapply every 2 hours as needed.  - Staying in the shade or wearing long sleeves, sun glasses (UVA+UVB protection) and wide brim hats (4-inch brim around the entire circumference of the hat) are also recommended for sun protection.  - Call for new or changing lesions.  Purpura - Chronic; persistent and recurrent.  Treatable, but not curable. - Violaceous  macules and patches - Benign - Related to trauma, age, sun damage and/or use of blood thinners, chronic use of topical and/or oral steroids - Observe - Can use OTC arnica containing moisturizer such as Dermend Bruise Formula if desired - Call for worsening or other concerns  - arms  ATOPIC DERMATITIS Exam: crusty scaly plaques at the right ankle  5% BSA, body mainly clear on systemic Dupixent  Chronic and persistent condition with duration or expected duration over one year. Condition is symptomatic/ bothersome to patient. Not currently at goal.   Atopic dermatitis (eczema) is a chronic, relapsing, pruritic condition that can significantly affect quality of life. It is often associated with allergic rhinitis and/or asthma and can require treatment with topical medications, phototherapy, or in severe cases biologic injectable medication (Dupixent; Adbry) or Oral JAK inhibitors.  Treatment Plan:  Pt has been on Dupixent > 3 yrs   Cont Dupixent 300mg /84ml sq injections q 2 wks Start Eucrisa oint qd/bid to aa eczema on body prn flares   Dupilumab (Dupixent) is a treatment given by injection for adults and children with moderate-to-severe atopic dermatitis. Goal is control of skin condition, not cure. It is given as 2 injections at the first dose followed by 1 injection ever 2 weeks thereafter.  Young children are dosed monthly.   Potential side effects include allergic reaction, herpes infections, injection site reactions and conjunctivitis (inflammation of the eyes).  The use of Dupixent requires long term medication management, including periodic office visits.   Recommend gentle skin care.   SKIN CANCER SCREENING PERFORMED TODAY   Return in about 6 months (around 06/27/2023) for Atopic  dermatitis, TBSE.  IAngelique Holm, CMA, am acting as scribe for Armida Sans, MD .   Documentation: I have reviewed the above documentation for accuracy and completeness, and I agree with the  above.  Armida Sans, MD

## 2022-12-28 NOTE — Patient Instructions (Signed)

## 2023-01-02 ENCOUNTER — Telehealth: Payer: Self-pay

## 2023-01-02 NOTE — Telephone Encounter (Signed)
Patient's Eucrisa not covered. [facility] states Flucinonide Ointment, Mometasone Ointment or Tacrolimus Ointment are on formulary.

## 2023-01-03 MED ORDER — TACROLIMUS 0.1 % EX OINT: TOPICAL_OINTMENT | CUTANEOUS | 2 refills | Status: AC

## 2023-01-03 NOTE — Telephone Encounter (Signed)
RX sent in and patient advised. aw 

## 2023-03-24 ENCOUNTER — Other Ambulatory Visit: Payer: Self-pay | Admitting: Family Medicine

## 2023-03-24 DIAGNOSIS — R7989 Other specified abnormal findings of blood chemistry: Secondary | ICD-10-CM

## 2023-03-27 ENCOUNTER — Ambulatory Visit
Admission: RE | Admit: 2023-03-27 | Discharge: 2023-03-27 | Disposition: A | Discharge status: Home / Self Care | Payer: Medicare Other | Source: Ambulatory Visit | Attending: Family Medicine | Admitting: Family Medicine

## 2023-03-27 DIAGNOSIS — R7989 Other specified abnormal findings of blood chemistry: Secondary | ICD-10-CM | POA: Insufficient documentation

## 2023-05-03 ENCOUNTER — Other Ambulatory Visit: Payer: Self-pay

## 2023-05-03 MED ORDER — DUPIXENT 300 MG/2ML ~~LOC~~ SOAJ: 300.0000 mg | SUBCUTANEOUS | 5 refills | Status: DC

## 2023-05-03 NOTE — Progress Notes (Signed)
 Patient requesting refills be sent to [facility] since he is now on Medicare and no longer enrolled in Dupixent [facility].

## 2023-05-16 ENCOUNTER — Other Ambulatory Visit: Payer: Self-pay

## 2023-05-16 MED ORDER — DUPIXENT 300 MG/2ML ~~LOC~~ SOAJ: 300.0000 mg | SUBCUTANEOUS | 5 refills | Status: DC

## 2023-05-16 NOTE — Progress Notes (Signed)
Dupixent switch per patient's request to [facility]. aw

## 2023-06-05 ENCOUNTER — Encounter: Payer: Self-pay | Admitting: Dermatology

## 2023-06-05 ENCOUNTER — Other Ambulatory Visit: Payer: Self-pay

## 2023-06-05 DIAGNOSIS — L209 Atopic dermatitis, unspecified: Secondary | ICD-10-CM

## 2023-06-05 MED ORDER — DUPIXENT 300 MG/2ML ~~LOC~~ SOAJ: 300.0000 mg | SUBCUTANEOUS | 5 refills | Status: DC

## 2023-06-05 NOTE — Progress Notes (Signed)
 Per patient preference Dupixent  prescription to be sent to [facility] phone [phone], fax [phone].

## 2023-07-05 ENCOUNTER — Encounter: Payer: Self-pay | Admitting: Dermatology

## 2023-07-05 ENCOUNTER — Ambulatory Visit (INDEPENDENT_AMBULATORY_CARE_PROVIDER_SITE_OTHER): Payer: Medicare Other | Admitting: Dermatology

## 2023-07-05 DIAGNOSIS — W908XXA Exposure to other nonionizing radiation, initial encounter: Secondary | ICD-10-CM

## 2023-07-05 DIAGNOSIS — L853 Xerosis cutis: Secondary | ICD-10-CM

## 2023-07-05 DIAGNOSIS — Z7189 Other specified counseling: Secondary | ICD-10-CM

## 2023-07-05 DIAGNOSIS — D692 Other nonthrombocytopenic purpura: Secondary | ICD-10-CM

## 2023-07-05 DIAGNOSIS — L578 Other skin changes due to chronic exposure to nonionizing radiation: Secondary | ICD-10-CM | POA: Diagnosis not present

## 2023-07-05 DIAGNOSIS — Z79899 Other long term (current) drug therapy: Secondary | ICD-10-CM

## 2023-07-05 DIAGNOSIS — L2089 Other atopic dermatitis: Secondary | ICD-10-CM | POA: Diagnosis not present

## 2023-07-05 MED ORDER — TRETINOIN 0.025 % EX CREA: TOPICAL_CREAM | CUTANEOUS | 11 refills | Status: DC

## 2023-07-05 MED ORDER — ZORYVE 0.15 % EX CREA: 1.0000 | TOPICAL_CREAM | Freq: Every day | CUTANEOUS | 11 refills | Status: AC

## 2023-07-05 NOTE — Progress Notes (Signed)
 Follow-Up Visit   Subjective  Dean Tanner is a 68 y.o. male who presents for the following: Atopic Dermatitis - pt doing well on Dupixent 300mg /77mL with no side effects. He does still have active areas and states Opzelura, tacrolimus, and Eucrisa haven't made much of a difference so he just uses CeraVe cream. Pt c/o fragile skin on the arms and bruising. He would like to discuss treatment options today.  The following portions of the chart were reviewed this encounter and updated as appropriate: medications, allergies, medical history  Review of Systems:  No other skin or systemic complaints except as noted in HPI or Assessment and Plan.  Objective  Well appearing patient in no apparent distress; mood and affect are within normal limits.  Areas Examined: The face, arms, and hands  Relevant physical exam findings are noted in the Assessment and Plan.   Assessment & Plan  OTHER ATOPIC DERMATITIS   Related Medications Ruxolitinib Phosphate (OPZELURA) 1.5 % CREA Apply 1 application topically daily. Apply to eczema QD PRN. PURPURA (HCC)   ACTINIC SKIN DAMAGE   MEDICATION MANAGEMENT   LONG-TERM USE OF HIGH-RISK MEDICATION   COUNSELING AND COORDINATION OF CARE    ATOPIC DERMATITIS - pt recently started Synthroid due to thyroid issues         Exam: Scaly pink papules coalescing to plaques on the legs and back 8% BSA Chronic and persistent condition with duration or expected duration over one year. Condition is improving with treatment but not currently at goal. Atopic dermatitis (eczema) is a chronic, relapsing, pruritic condition that can significantly affect quality of life. It is often associated with allergic rhinitis and/or asthma and can require treatment with topical medications, phototherapy, or in severe cases biologic injectable medication (Dupixent; Adbry) or Oral JAK inhibitors.  Treatment Plan: Pt has been on Dupixent > 3 yrs   Cont Dupixent  300mg /107ml sq injections q 2 wks. Start Zoryve cream to aa's BID. Samples given. Recommend baby oil after showering.   Dupilumab (Dupixent) is a treatment given by injection for adults and children with moderate-to-severe atopic dermatitis. Goal is control of skin condition, not cure. It is given as 2 injections at the first dose followed by 1 injection ever 2 weeks thereafter.  Young children are dosed monthly.   Potential side effects include allergic reaction, herpes infections, injection site reactions and conjunctivitis (inflammation of the eyes).  The use of Dupixent requires long term medication management, including periodic office visits.   Recommend gentle skin care.  Consider Cibinqo, Rinvoq, and Nemluvio if patient continues to flare.  ACTINIC DAMAGE - chronic, secondary to cumulative UV radiation exposure/sun exposure over time - diffuse scaly erythematous macules with underlying dyspigmentation - Recommend daily broad spectrum sunscreen SPF 30+ to sun-exposed areas, reapply every 2 hours as needed.  - Recommend staying in the shade or wearing long sleeves, sun glasses (UVA+UVB protection) and wide brim hats (4-inch brim around the entire circumference of the hat). - Call for new or changing lesions. - Start Tretinoin 0.025% cream QHS. Topical retinoid medications like tretinoin/Retin-A, adapalene/Differin, tazarotene/Fabior, and Epiduo/Epiduo Forte can cause dryness and irritation when first started. Only apply a pea-sized amount to the entire affected area. Avoid applying it around the eyes, edges of mouth and creases at the nose. If you experience irritation, use a good moisturizer first and/or apply the medicine less often. If you are doing well with the medicine, you can increase how often you use it until you are applying  every night. Be careful with sun protection while using this medication as it can make you sensitive to the sun. This medicine should not be used by pregnant  women.   Xerosis - diffuse xerotic patches - recommend gentle, hydrating skin care - gentle skin care handout given  Purpura - Chronic; persistent and recurrent.  Treatable, but not curable. - Violaceous macules and patches - Benign - Related to trauma, age, sun damage and/or use of blood thinners, chronic use of topical and/or oral steroids - Observe - Can use OTC arnica containing moisturizer such as Dermend Bruise Formula if desired - RetinA at bedtime Rx sent - Call for worsening or other concerns  Return in about 6 months (around 01/05/2024) for atopic dermatitis follow up.  Maylene Roes, CMA, am acting as scribe for Armida Sans, MD .  Documentation: I have reviewed the above documentation for accuracy and completeness, and I agree with the above.  Armida Sans, MD

## 2023-07-05 NOTE — Patient Instructions (Addendum)
 Purpura - Chronic; persistent and recurrent.  Treatable, but not curable. - Violaceous macules and patches - Benign - Related to trauma, age, sun damage and/or use of blood thinners, chronic use of topical and/or oral steroids - Observe - Can use OTC arnica containing moisturizer such as Dermend Bruise Formula if desired - Call for worsening or other concerns  Gentle Skin Care Guide  1. Bathe no more than once a day.  2. Avoid bathing in hot water  3. Use a mild soap like Dove, Vanicream, Cetaphil, CeraVe. Can use Lever 2000 or Cetaphil antibacterial soap  4. Use soap only where you need it. On most days, use it under your arms, between your legs, and on your feet. Let the water rinse other areas unless visibly dirty.  5. When you get out of the bath/shower, use a towel to gently blot your skin dry, don't rub it.  6. While your skin is still a little damp, apply a moisturizing cream such as Vanicream, CeraVe, Cetaphil, Eucerin, Sarna lotion or plain Vaseline Jelly. For hands apply Neutrogena [place] Hand Cream or Excipial Hand Cream.  7. Reapply moisturizer any time you start to itch or feel dry.  8. Sometimes using free and clear laundry detergents can be helpful. Fabric softener sheets should be avoided. Downy Free & Gentle liquid, or any liquid fabric softener that is free of dyes and perfumes, it acceptable to use  9. If your doctor has given you prescription creams you may apply moisturizers over them     Due to recent changes in healthcare laws, you may see results of your pathology and/or laboratory studies on MyChart before the doctors have had a chance to review them. We understand that in some cases there may be results that are confusing or concerning to you. Please understand that not all results are received at the same time and often the doctors may need to interpret multiple results in order to provide you with the best plan of care or course of treatment. Therefore, we  ask that you please give [place] 2 business days to thoroughly review all your results before contacting the office for clarification. Should we see a critical lab result, you will be contacted sooner.   If You Need Anything After Your Visit  If you have any questions or concerns for your doctor, please call our main line at [phone] and press option 4 to reach your doctor's medical assistant. If no one answers, please leave a voicemail as directed and we will return your call as soon as possible. Messages left after 4 pm will be answered the following business day.   You may also send [place] a message via MyChart. We typically respond to MyChart messages within 1-2 business days.  For prescription refills, please ask your pharmacy to contact our office. Our fax number is [phone].  If you have an urgent issue when the clinic is closed that cannot wait until the next business day, you can page your doctor at the number below.    Please note that while we do our best to be available for urgent issues outside of office hours, we are not available 24/7.   If you have an urgent issue and are unable to reach [place], you may choose to seek medical care at your doctor's office, retail clinic, urgent care center, or emergency room.  If you have a medical emergency, please immediately call 911 or go to the emergency department.  Pager Numbers  - Dr.  Gwen Pounds: [phone]  - Dr. Roseanne Reno: [phone]  - Dr. Katrinka Blazing: [phone]   In the event of inclement weather, please call our main line at [phone] for an update on the status of any delays or closures.  Dermatology Medication Tips: Please keep the boxes that topical medications come in in order to help keep track of the instructions about where and how to use these. Pharmacies typically print the medication instructions only on the boxes and not directly on the medication tubes.   If your medication is too expensive, please contact our office  at [phone] option 4 or send [place] a message through MyChart.   We are unable to tell what your co-pay for medications will be in advance as this is different depending on your insurance coverage. However, we may be able to find a substitute medication at lower cost or fill out paperwork to get insurance to cover a needed medication.   If a prior authorization is required to get your medication covered by your insurance company, please allow [place] 1-2 business days to complete this process.  Drug prices often vary depending on where the prescription is filled and some pharmacies may offer cheaper prices.  The website www.goodrx.com contains coupons for medications through different pharmacies. The prices here do not account for what the cost may be with help from insurance (it may be cheaper with your insurance), but the website can give you the price if you did not use any insurance.  - You can print the associated coupon and take it with your prescription to the pharmacy.  - You may also stop by our office during regular business hours and pick up a GoodRx coupon card.  - If you need your prescription sent electronically to a different pharmacy, notify our office through [facility] or by phone at [phone] option 4.     Si Usted Necesita Algo Despus de Su Visita  Tambin puede enviarnos un mensaje a travs de Clinical cytogeneticist. Por lo general respondemos a los mensajes de MyChart en el transcurso de 1 a 2 das hbiles.  Para renovar recetas, por favor pida a su farmacia que se ponga en contacto con nuestra oficina. Annie Sable de fax es [place] [phone].  Si tiene un asunto urgente cuando la clnica est cerrada y que no puede esperar hasta el siguiente da hbil, puede llamar/localizar a su doctor(a) al nmero que aparece a continuacin.   Por favor, tenga en cuenta que aunque hacemos todo lo posible para estar disponibles para asuntos urgentes fuera del horario de [place], no estamos  disponibles las 24 horas del da, los 7 [address]  [address] de la [place].   Si tiene un problema urgente y no puede comunicarse con nosotros, puede optar por buscar atencin mdica  en el consultorio de su doctor(a), en una clnica privada, en un centro de atencin urgente o en una sala de emergencias.  Si tiene Engineer, drilling, por favor llame inmediatamente al 911 o vaya a la sala de emergencias.  Nmeros de bper  - Dr. Gwen Pounds: [phone]  - Dra. Roseanne Reno: [phone]  - Dr. Katrinka Blazing: [phone]   En caso de inclemencias del tiempo, por favor llame a Lacy Duverney principal al [phone] para una actualizacin sobre el [place] de cualquier retraso o cierre.  Consejos para la medicacin en dermatologa: Por favor, guarde las cajas en las que vienen los medicamentos de uso tpico para ayudarle a seguir las instrucciones sobre dnde y cmo usarlos. Las farmacias generalmente imprimen las instrucciones del  medicamento slo en las cajas y no directamente en los tubos del medicamento.   Si su medicamento es muy caro, por favor, pngase en contacto con Rolm Gala llamando al [phone] y presione la opcin 4 o envenos un mensaje a travs de Clinical cytogeneticist.   No podemos decirle cul ser su copago por los medicamentos por adelantado ya que esto es diferente dependiendo de la cobertura de su seguro. Sin embargo, es posible que podamos encontrar un medicamento sustituto a Audiological scientist un formulario para que el seguro cubra el medicamento que se considera necesario.   Si se requiere una autorizacin previa para que su compaa de seguros [place] su medicamento, por favor permtanos de 1 a 2 das hbiles para completar [address]  Los precios de los medicamentos varan con frecuencia dependiendo del Environmental consultant de dnde se surte la receta y alguna farmacias pueden ofrecer precios ms baratos.  El sitio web www.goodrx.com tiene cupones para medicamentos de Health and safety inspector. Los precios aqu no  tienen en cuenta lo que podra costar con la ayuda del seguro (puede ser ms barato con su seguro), pero el sitio web puede darle el precio si no utiliz Tourist information centre manager.  - Puede imprimir el cupn correspondiente y llevarlo con su receta a la farmacia.  - Tambin puede pasar por nuestra oficina durante el horario de atencin regular y Education officer, museum una tarjeta de cupones de GoodRx.  - Si necesita que su receta se enve electrnicamente a una farmacia diferente, informe a nuestra oficina a travs de MyChart de [place] o por telfono llamando al [phone] y presione la opcin 4.

## 2023-07-06 ENCOUNTER — Telehealth: Payer: Self-pay

## 2023-07-06 NOTE — Telephone Encounter (Signed)
 Patient has tried and failed Opzelura, tacrolimus, and [place]. I gave him samples of Zoryve cream at the time of his visit.

## 2023-07-06 NOTE — Telephone Encounter (Signed)
 Zoryve cream not covered by insurance and patient has Medicare so Dean Tanner will not offer him the cash pay price. Please advise.

## 2023-07-10 ENCOUNTER — Other Ambulatory Visit: Payer: Self-pay

## 2023-07-10 MED ORDER — TRETINOIN 0.025 % EX CREA: TOPICAL_CREAM | CUTANEOUS | 4 refills | Status: AC

## 2023-07-10 NOTE — Progress Notes (Signed)
 Fax received that patient is requesting a 90 day supply be sent to [facility].

## 2023-08-30 ENCOUNTER — Other Ambulatory Visit: Payer: Self-pay | Admitting: Family Medicine

## 2023-08-30 DIAGNOSIS — Z87891 Personal history of nicotine dependence: Secondary | ICD-10-CM

## 2023-09-11 ENCOUNTER — Ambulatory Visit
Admission: RE | Admit: 2023-09-11 | Discharge: 2023-09-11 | Disposition: A | Discharge status: Home / Self Care | Source: Ambulatory Visit | Attending: Family Medicine | Admitting: Family Medicine

## 2023-09-11 DIAGNOSIS — Z87891 Personal history of nicotine dependence: Secondary | ICD-10-CM | POA: Diagnosis present

## 2024-01-09 ENCOUNTER — Encounter: Payer: Self-pay | Admitting: Dermatology

## 2024-01-09 ENCOUNTER — Ambulatory Visit (INDEPENDENT_AMBULATORY_CARE_PROVIDER_SITE_OTHER): Admitting: Dermatology

## 2024-01-09 DIAGNOSIS — Z85828 Personal history of other malignant neoplasm of skin: Secondary | ICD-10-CM

## 2024-01-09 DIAGNOSIS — Z79899 Other long term (current) drug therapy: Secondary | ICD-10-CM

## 2024-01-09 DIAGNOSIS — D692 Other nonthrombocytopenic purpura: Secondary | ICD-10-CM

## 2024-01-09 DIAGNOSIS — D1801 Hemangioma of skin and subcutaneous tissue: Secondary | ICD-10-CM

## 2024-01-09 DIAGNOSIS — L578 Other skin changes due to chronic exposure to nonionizing radiation: Secondary | ICD-10-CM

## 2024-01-09 DIAGNOSIS — L821 Other seborrheic keratosis: Secondary | ICD-10-CM

## 2024-01-09 DIAGNOSIS — W908XXA Exposure to other nonionizing radiation, initial encounter: Secondary | ICD-10-CM | POA: Diagnosis not present

## 2024-01-09 DIAGNOSIS — Z7189 Other specified counseling: Secondary | ICD-10-CM

## 2024-01-09 DIAGNOSIS — L209 Atopic dermatitis, unspecified: Secondary | ICD-10-CM

## 2024-01-09 DIAGNOSIS — Z1283 Encounter for screening for malignant neoplasm of skin: Secondary | ICD-10-CM | POA: Diagnosis not present

## 2024-01-09 DIAGNOSIS — L814 Other melanin hyperpigmentation: Secondary | ICD-10-CM | POA: Diagnosis not present

## 2024-01-09 DIAGNOSIS — D229 Melanocytic nevi, unspecified: Secondary | ICD-10-CM

## 2024-01-09 DIAGNOSIS — L2089 Other atopic dermatitis: Secondary | ICD-10-CM

## 2024-01-09 NOTE — Progress Notes (Signed)
 Follow-Up Visit   Subjective  Dean Tanner is a 68 y.o. male who presents for the following: Atopic Dermatitis Patient here for full body skin exam and skin cancer screening.  Currently on Dupixent . Opzelura , tacrolimus , and Eucrisa  haven't made much of a difference so he just uses CeraVe cream.  Consistent flare at right ankle.  The following portions of the chart were reviewed this encounter and updated as appropriate: medications, allergies, medical history  Review of Systems:  No other skin or systemic complaints except as noted in HPI or Assessment and Plan.  Objective  Well appearing patient in no apparent distress; mood and affect are within normal limits.  Areas Examined: All skin waist up examined.  Relevant physical exam findings are noted in the Assessment and Plan.    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  - Call if any new or changing lesions are noted between office visits   ATOPIC DERMATITIS Exam: 4 cm patch of pinkness with scale at right anterior ankle 2% BSA Chronic and persistent condition with duration or expected duration over one year. Condition is symptomatic/ bothersome to patient. Not currently at goal. Atopic dermatitis - Severe, on Dupixent  (biologic medication).  Atopic dermatitis (eczema) is a chronic, relapsing, pruritic condition that can significantly affect quality of life. It is often associated with allergic rhinitis and/or asthma and can require treatment with topical medications, phototherapy, or in severe cases biologic medications, which require long term medication management.    Treatment Plan: Pt has been on Dupixent  > 3 yrs Continue Dupixent  300 mg/2mL SQ QOW. Patient denies side effects. Consider Cibinqo,  Rinvoq, and Nemluvio if patient does not continue to do well.   Potential side effects include allergic reaction, herpes infections, injection site reactions and conjunctivitis (inflammation of the eyes).  The use of Dupixent  requires long term medication management, including periodic office visits.    Long term medication management.  Patient is using long term (months to years) prescription medication  to control their dermatologic condition.  These medications require periodic monitoring to evaluate for efficacy and side effects and may require periodic laboratory monitoring.   Recommend gentle skin care.   LENTIGINES, SEBORRHEIC KERATOSES, HEMANGIOMAS - Benign normal skin lesions - Benign-appearing - Call for any changes  MELANOCYTIC NEVI - Tan-brown and/or pink-flesh-colored symmetric macules and papules - Benign appearing on exam today - Observation - Call clinic for new or changing moles - Recommend daily use of broad spectrum spf 30+ sunscreen to sun-exposed areas.   ACTINIC DAMAGE - Chronic condition, secondary to cumulative UV/sun exposure - diffuse scaly erythematous macules with underlying dyspigmentation - Recommend daily broad spectrum sunscreen SPF 30+ to sun-exposed areas, reapply every 2 hours as needed.  - Staying in the shade or wearing long sleeves, sun glasses (UVA+UVB protection) and wide brim hats (4-inch brim around the entire circumference of the hat) are also recommended for sun protection.  - Call for new or changing lesions.  SKIN CANCER SCREENING PERFORMED TODAY  Purpura - Chronic; persistent and recurrent.  Treatable, but not curable. - Violaceous macules and patches - Benign - Related to trauma, age, sun damage and/or use of blood thinners, chronic use of topical and/or oral steroids - Observe - Can use OTC arnica containing moisturizer  such as Dermend Bruise Formula if desired - Call for worsening or other concerns  Return in about 6 months  (around 07/08/2024) for TBSE, with Dr. MARLA, Atopic Dermatitis, [facility].  Dean Tanner Dean Tanner, RMA, am acting as scribe for Dean Rhyme, MD .   Documentation: I have reviewed the above documentation for accuracy and completeness, and I agree with the above.  Dean Rhyme, MD

## 2024-01-09 NOTE — Patient Instructions (Signed)

## 2024-01-26 ENCOUNTER — Other Ambulatory Visit: Payer: Self-pay | Admitting: Dermatology

## 2024-01-26 DIAGNOSIS — L209 Atopic dermatitis, unspecified: Secondary | ICD-10-CM

## 2024-01-29 ENCOUNTER — Other Ambulatory Visit: Payer: Self-pay

## 2024-01-29 DIAGNOSIS — L209 Atopic dermatitis, unspecified: Secondary | ICD-10-CM

## 2024-01-29 MED ORDER — DUPIXENT 300 MG/2ML ~~LOC~~ SOAJ: 300.0000 mg | SUBCUTANEOUS | 5 refills | Status: AC

## 2024-07-08 ENCOUNTER — Ambulatory Visit: Admitting: Dermatology
